# Patient Record
Sex: Female | Born: 1965 | Race: White | Hispanic: No | Marital: Married | Smoking: Never smoker
Health system: Southern US, Community
[De-identification: ages and names within clinical notes are randomized; demographics above are authoritative.]

## PROBLEM LIST (undated history)

## (undated) DIAGNOSIS — N189 Chronic kidney disease, unspecified: Secondary | ICD-10-CM

## (undated) DIAGNOSIS — M1990 Unspecified osteoarthritis, unspecified site: Secondary | ICD-10-CM

## (undated) DIAGNOSIS — Z8619 Personal history of other infectious and parasitic diseases: Secondary | ICD-10-CM

## (undated) DIAGNOSIS — R351 Nocturia: Secondary | ICD-10-CM

## (undated) DIAGNOSIS — G971 Other reaction to spinal and lumbar puncture: Secondary | ICD-10-CM

## (undated) DIAGNOSIS — J189 Pneumonia, unspecified organism: Secondary | ICD-10-CM

## (undated) DIAGNOSIS — N051 Unspecified nephritic syndrome with focal and segmental glomerular lesions: Secondary | ICD-10-CM

## (undated) DIAGNOSIS — I10 Essential (primary) hypertension: Secondary | ICD-10-CM

## (undated) DIAGNOSIS — G4700 Insomnia, unspecified: Secondary | ICD-10-CM

## (undated) DIAGNOSIS — M2550 Pain in unspecified joint: Secondary | ICD-10-CM

## (undated) HISTORY — PX: COLONOSCOPY: SHX174

## (undated) HISTORY — PX: ABDOMINAL HYSTERECTOMY: SHX81

## (undated) HISTORY — PX: HAND SURGERY: SHX662

---

## 1998-07-05 ENCOUNTER — Other Ambulatory Visit: Admission: RE | Admit: 1998-07-05 | Discharge: 1998-07-05 | Payer: Self-pay | Admitting: Obstetrics & Gynecology

## 1999-06-06 ENCOUNTER — Other Ambulatory Visit: Admission: RE | Admit: 1999-06-06 | Discharge: 1999-06-06 | Payer: Self-pay | Admitting: Obstetrics and Gynecology

## 1999-12-18 ENCOUNTER — Inpatient Hospital Stay (HOSPITAL_COMMUNITY): Admission: AD | Admit: 1999-12-18 | Discharge: 1999-12-21 | Payer: Self-pay | Admitting: Obstetrics and Gynecology

## 1999-12-18 ENCOUNTER — Encounter (INDEPENDENT_AMBULATORY_CARE_PROVIDER_SITE_OTHER): Payer: Self-pay | Admitting: Specialist

## 2000-01-24 ENCOUNTER — Other Ambulatory Visit: Admission: RE | Admit: 2000-01-24 | Discharge: 2000-01-24 | Payer: Self-pay | Admitting: Obstetrics and Gynecology

## 2000-07-28 DIAGNOSIS — G971 Other reaction to spinal and lumbar puncture: Secondary | ICD-10-CM

## 2000-07-28 HISTORY — DX: Other reaction to spinal and lumbar puncture: G97.1

## 2001-01-21 ENCOUNTER — Other Ambulatory Visit: Admission: RE | Admit: 2001-01-21 | Discharge: 2001-01-21 | Payer: Self-pay | Admitting: Obstetrics and Gynecology

## 2001-07-22 ENCOUNTER — Inpatient Hospital Stay (HOSPITAL_COMMUNITY): Admission: AD | Admit: 2001-07-22 | Discharge: 2001-07-22 | Payer: Self-pay | Admitting: Obstetrics and Gynecology

## 2001-07-25 ENCOUNTER — Encounter (INDEPENDENT_AMBULATORY_CARE_PROVIDER_SITE_OTHER): Payer: Self-pay

## 2001-07-25 ENCOUNTER — Inpatient Hospital Stay (HOSPITAL_COMMUNITY): Admission: AD | Admit: 2001-07-25 | Discharge: 2001-07-28 | Payer: Self-pay | Admitting: Obstetrics & Gynecology

## 2001-08-02 ENCOUNTER — Inpatient Hospital Stay (HOSPITAL_COMMUNITY): Admission: AD | Admit: 2001-08-02 | Discharge: 2001-08-02 | Payer: Self-pay | Admitting: Obstetrics & Gynecology

## 2001-09-01 ENCOUNTER — Other Ambulatory Visit: Admission: RE | Admit: 2001-09-01 | Discharge: 2001-09-01 | Payer: Self-pay | Admitting: Obstetrics and Gynecology

## 2001-12-01 ENCOUNTER — Ambulatory Visit (HOSPITAL_COMMUNITY): Admission: RE | Admit: 2001-12-01 | Discharge: 2001-12-01 | Payer: Self-pay | Admitting: Orthopedic Surgery

## 2001-12-01 ENCOUNTER — Encounter: Payer: Self-pay | Admitting: Orthopedic Surgery

## 2002-10-26 ENCOUNTER — Other Ambulatory Visit: Admission: RE | Admit: 2002-10-26 | Discharge: 2002-10-26 | Payer: Self-pay | Admitting: Obstetrics and Gynecology

## 2003-11-02 ENCOUNTER — Other Ambulatory Visit: Admission: RE | Admit: 2003-11-02 | Discharge: 2003-11-02 | Payer: Self-pay | Admitting: Obstetrics and Gynecology

## 2005-01-03 ENCOUNTER — Other Ambulatory Visit: Admission: RE | Admit: 2005-01-03 | Discharge: 2005-01-03 | Payer: Self-pay | Admitting: Obstetrics and Gynecology

## 2007-02-11 ENCOUNTER — Ambulatory Visit (HOSPITAL_COMMUNITY): Admission: RE | Admit: 2007-02-11 | Discharge: 2007-02-11 | Payer: Self-pay | Admitting: Chiropractic Medicine

## 2008-01-19 ENCOUNTER — Encounter (HOSPITAL_COMMUNITY): Admission: RE | Admit: 2008-01-19 | Discharge: 2008-03-29 | Payer: Self-pay | Admitting: Nephrology

## 2009-10-09 ENCOUNTER — Encounter (INDEPENDENT_AMBULATORY_CARE_PROVIDER_SITE_OTHER): Payer: Self-pay | Admitting: Obstetrics and Gynecology

## 2009-10-09 ENCOUNTER — Ambulatory Visit (HOSPITAL_COMMUNITY): Admission: RE | Admit: 2009-10-09 | Discharge: 2009-10-10 | Payer: Self-pay | Admitting: Obstetrics and Gynecology

## 2009-10-22 ENCOUNTER — Encounter (HOSPITAL_COMMUNITY): Admission: RE | Admit: 2009-10-22 | Discharge: 2010-01-20 | Payer: Self-pay | Admitting: Nephrology

## 2010-10-15 LAB — BASIC METABOLIC PANEL WITH GFR
BUN: 13 mg/dL (ref 6–23)
CO2: 25 meq/L (ref 19–32)
Calcium: 8.6 mg/dL (ref 8.4–10.5)
Chloride: 108 meq/L (ref 96–112)
Creatinine, Ser: 1.02 mg/dL (ref 0.4–1.2)
GFR calc non Af Amer: 59 mL/min — ABNORMAL LOW
Glucose, Bld: 97 mg/dL (ref 70–99)
Potassium: 4.2 meq/L (ref 3.5–5.1)
Sodium: 137 meq/L (ref 135–145)

## 2010-10-15 LAB — POCT HEMOGLOBIN-HEMACUE
Hemoglobin: 11.6 g/dL — ABNORMAL LOW (ref 12.0–15.0)
Hemoglobin: 9.9 g/dL — ABNORMAL LOW (ref 12.0–15.0)

## 2010-10-15 LAB — IRON AND TIBC
Iron: 20 ug/dL — ABNORMAL LOW (ref 42–135)
Saturation Ratios: 7 % — ABNORMAL LOW (ref 20–55)
TIBC: 297 ug/dL (ref 250–470)
UIBC: 277 ug/dL

## 2010-10-16 LAB — POCT HEMOGLOBIN-HEMACUE: Hemoglobin: 8.9 g/dL — ABNORMAL LOW (ref 12.0–15.0)

## 2010-10-18 LAB — CBC
HCT: 34.7 % — ABNORMAL LOW (ref 36.0–46.0)
Hemoglobin: 11.3 g/dL — ABNORMAL LOW (ref 12.0–15.0)
Hemoglobin: 6.8 g/dL — CL (ref 12.0–15.0)
RBC: 2.51 MIL/uL — ABNORMAL LOW (ref 3.87–5.11)
RBC: 4.15 MIL/uL (ref 3.87–5.11)
RDW: 14.6 % (ref 11.5–15.5)
WBC: 16.6 10*3/uL — ABNORMAL HIGH (ref 4.0–10.5)
WBC: 7.8 10*3/uL (ref 4.0–10.5)

## 2010-10-18 LAB — URINE MICROSCOPIC-ADD ON

## 2010-10-18 LAB — URINALYSIS, ROUTINE W REFLEX MICROSCOPIC
Bilirubin Urine: NEGATIVE
Glucose, UA: NEGATIVE mg/dL
Protein, ur: 30 mg/dL — AB
Specific Gravity, Urine: 1.015 (ref 1.005–1.030)

## 2010-10-18 LAB — COMPREHENSIVE METABOLIC PANEL
ALT: 17 U/L (ref 0–35)
Alkaline Phosphatase: 48 U/L (ref 39–117)
CO2: 28 mEq/L (ref 19–32)
Chloride: 104 mEq/L (ref 96–112)
Glucose, Bld: 81 mg/dL (ref 70–99)
Potassium: 3.9 mEq/L (ref 3.5–5.1)
Sodium: 137 mEq/L (ref 135–145)
Total Bilirubin: 0.2 mg/dL — ABNORMAL LOW (ref 0.3–1.2)
Total Protein: 6.9 g/dL (ref 6.0–8.3)

## 2010-10-18 LAB — HEMOGLOBIN AND HEMATOCRIT, BLOOD
HCT: 20.2 % — ABNORMAL LOW (ref 36.0–46.0)
Hemoglobin: 6.5 g/dL — CL (ref 12.0–15.0)

## 2010-10-18 LAB — PROTIME-INR: Prothrombin Time: 12.7 seconds (ref 11.6–15.2)

## 2012-01-27 ENCOUNTER — Other Ambulatory Visit: Payer: Self-pay | Admitting: Nephrology

## 2012-01-27 DIAGNOSIS — I10 Essential (primary) hypertension: Secondary | ICD-10-CM

## 2012-01-27 DIAGNOSIS — N181 Chronic kidney disease, stage 1: Secondary | ICD-10-CM

## 2012-02-09 ENCOUNTER — Ambulatory Visit
Admission: RE | Admit: 2012-02-09 | Discharge: 2012-02-09 | Disposition: A | Discharge status: Home / Self Care | Payer: BC Managed Care – PPO | Source: Ambulatory Visit | Attending: Nephrology | Admitting: Nephrology

## 2012-02-09 DIAGNOSIS — I10 Essential (primary) hypertension: Secondary | ICD-10-CM

## 2012-02-09 DIAGNOSIS — N181 Chronic kidney disease, stage 1: Secondary | ICD-10-CM

## 2013-02-03 IMAGING — US US RENAL
1 series · 14 of 25 positions shown · non-contrast
Comparison: None.

` *RADIOLOGY REPORT*
CLINICAL DATA: Chronic kidney disease, family history of renal cell
carcinoma, on medication for hypertension

RENAL/URINARY TRACT ULTRASOUND COMPLETE

[Series 1: us renal · 0.26mm/px · 14 of 32 slices shown]
[im 1/32]
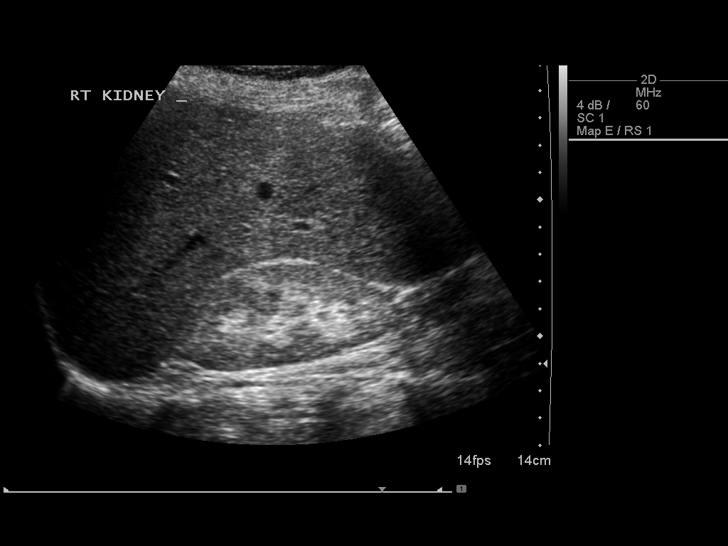
[im 3/32]
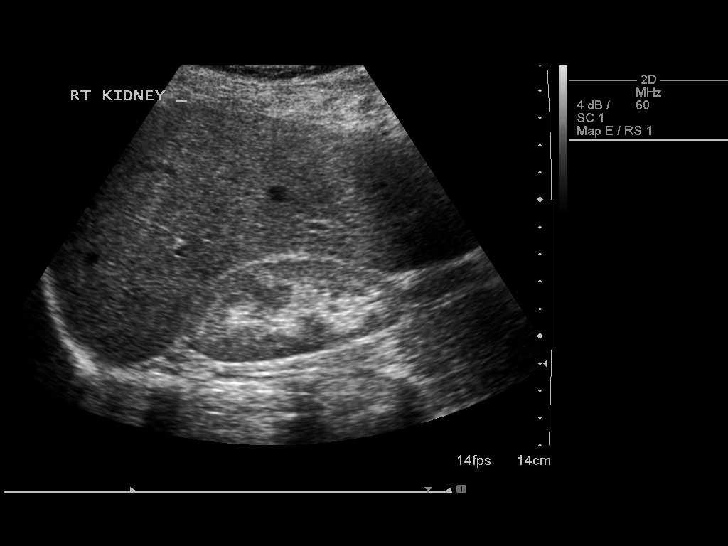
[im 6/32]
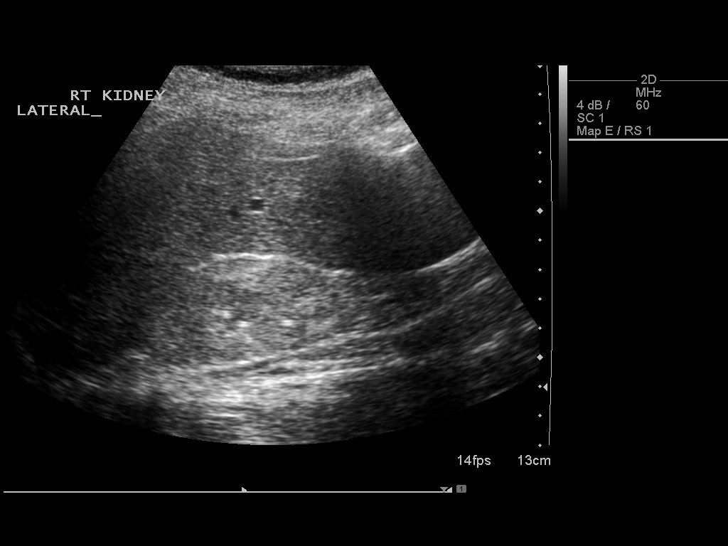
[im 8/32]
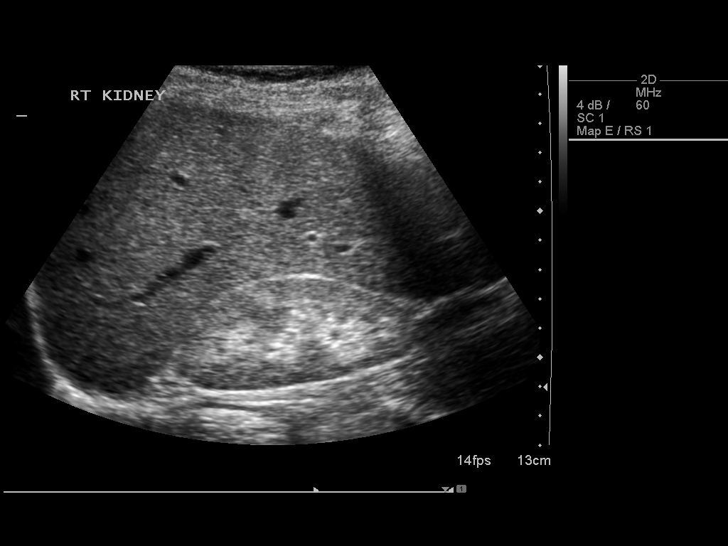
[im 11/32]
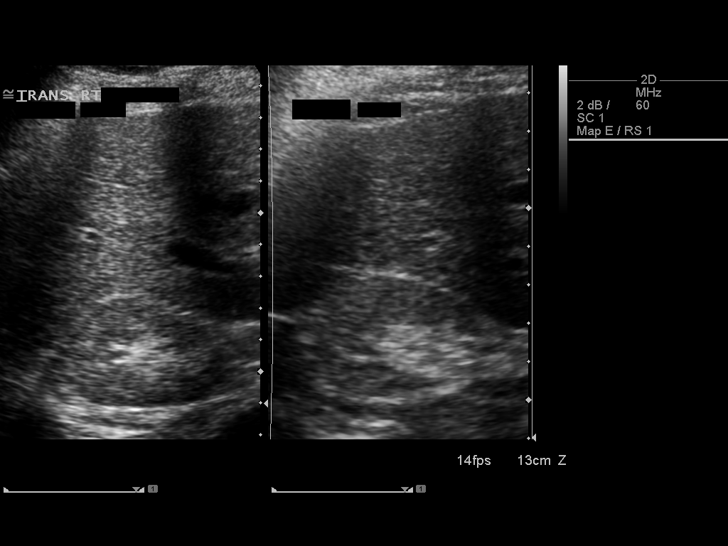
[im 12/32]
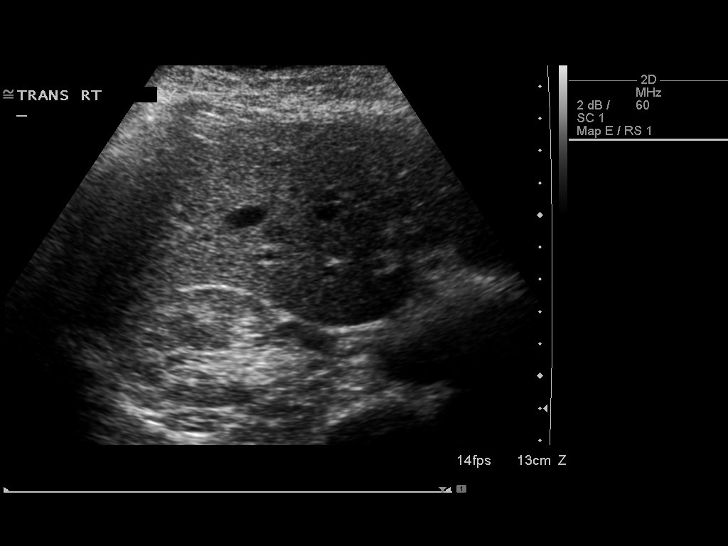
[im 15/32]
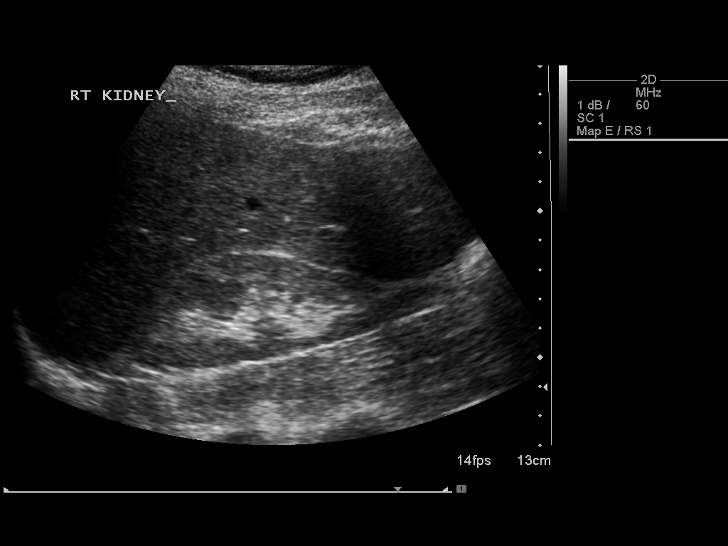
[im 17/32]
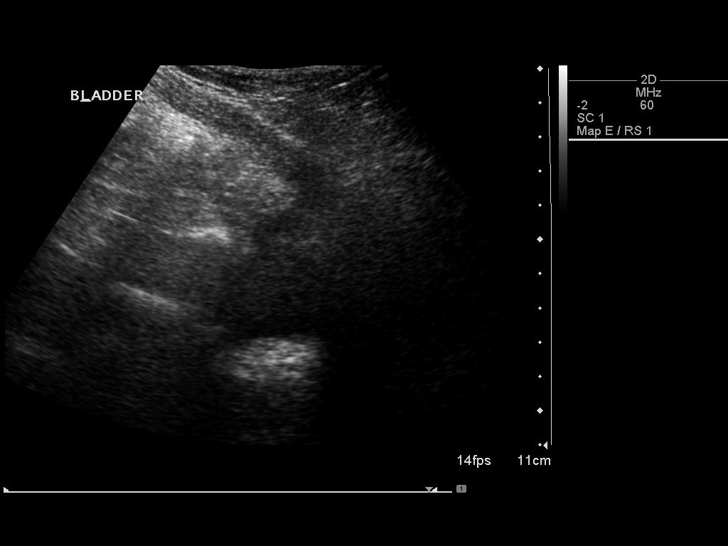
[im 20/32]
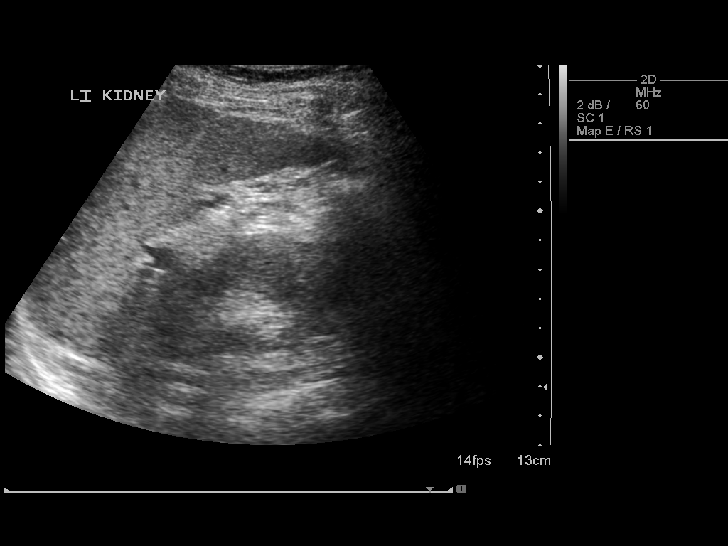
[im 21/32]
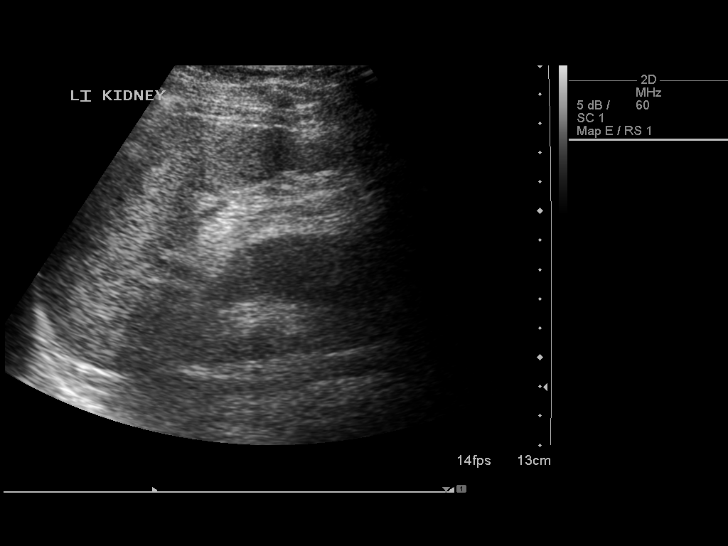
[im 24/32]
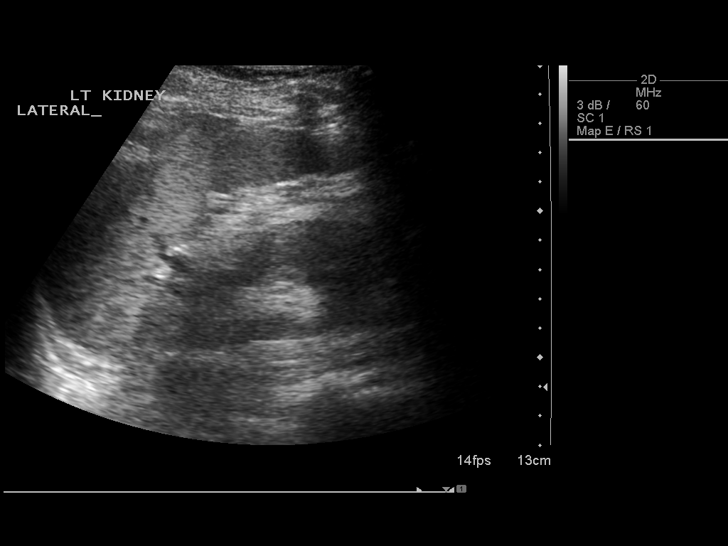
[im 26/32]
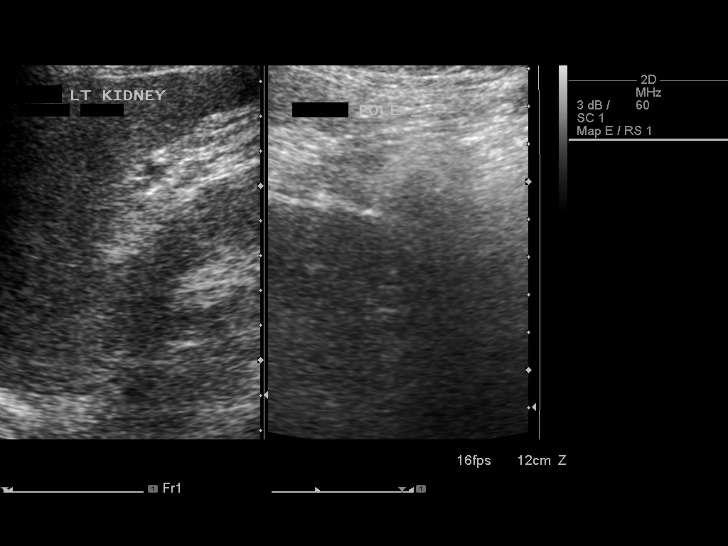
[im 29/32]
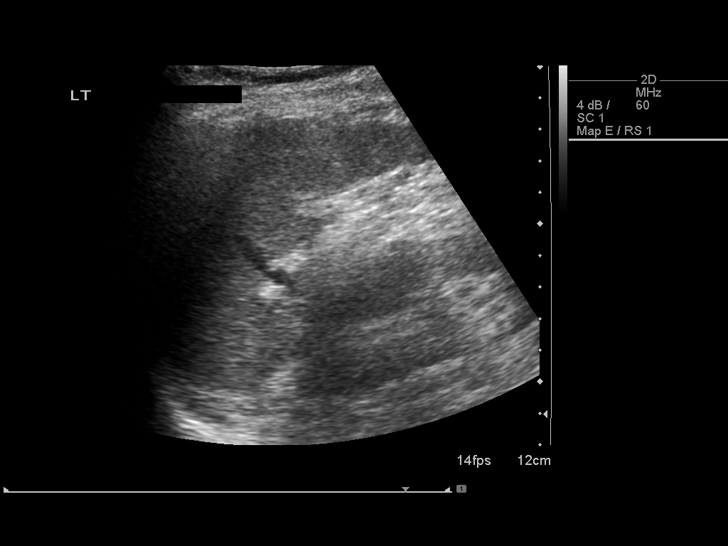
[im 32/32]
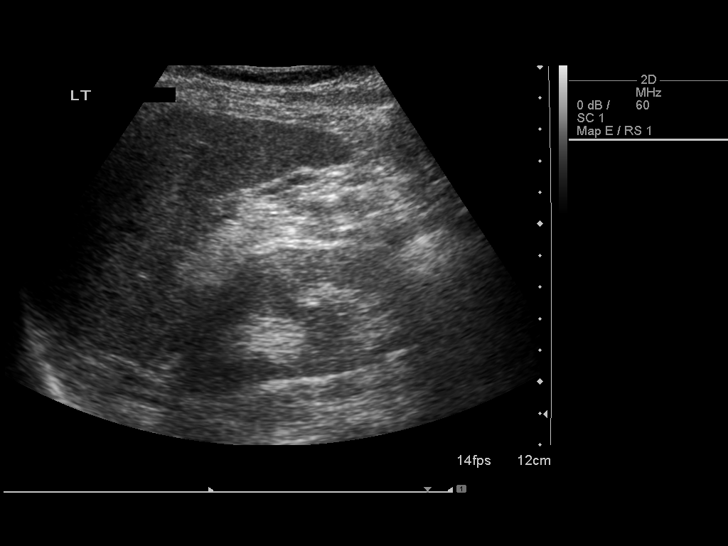

[14 of 25 positions shown; findings below may reference images not displayed]

FINDINGS: Right Kidney:  No hydronephrosis is seen.  The right kidney
measures 9.1 cm sagittally and is echogenic consistent with chronic
renal medical disease.  No renal mass is seen.

Left Kidney:  The left kidney is more difficult to visualize due to
bowel gas, measuring 9.4 cm sagittally.  No renal mass is evident.

Bladder:  The urinary bladder is not well distended.
IMPRESSION: 1.  No hydronephrosis.  No renal mass.
2.  Somewhat echogenic renal parenchyma consistent with chronic
renal medical disease.

## 2015-03-01 ENCOUNTER — Other Ambulatory Visit: Payer: Self-pay | Admitting: Obstetrics and Gynecology

## 2015-03-02 LAB — CYTOLOGY - PAP

## 2016-07-07 DIAGNOSIS — N189 Chronic kidney disease, unspecified: Secondary | ICD-10-CM | POA: Diagnosis present

## 2016-07-07 DIAGNOSIS — M1611 Unilateral primary osteoarthritis, right hip: Secondary | ICD-10-CM | POA: Diagnosis present

## 2016-07-07 DIAGNOSIS — I10 Essential (primary) hypertension: Secondary | ICD-10-CM | POA: Diagnosis present

## 2016-07-07 NOTE — H&P (Signed)
PREOPERATIVE H&P Patient ID: Helen Rogers MRN: [id number] DOB/AGE: 1965-10-09 50 y.o.  Chief Complaint: OA RIGHT HIP  Planned Procedure Date: 07/29/16  Medical and Cardiac Clearance by Dr. Lavone Neri   Additional clearance by Nephrology: Dr. Marval Regal  HPI: Helen Rogers is a 50 y.o. female with a history of HTN and CKD dt FSGS who presents for evaluation of OA RIGHT HIP. The patient has a history of pain and functional disability in the right hip due to arthritis and has failed non-surgical conservative treatments for greater than 12 weeks to include NSAID's and/or analgesics, corticosteriod injections and activity modification.  Onset of symptoms was gradual, starting 3 +years ago with gradually worsening course since that time.  Patient currently rates pain at 9 out of 10 with activity. Patient has night pain, worsening of pain with activity and weight bearing, pain that interferes with activities of daily living and pain with passive range of motion.  Patient has evidence of periarticular osteophytes and joint space narrowing by imaging studies.  There is no active infection.  Past Medical History: HTN, CKD  Past Surgical History: Hysterectomy 2011  Allergies: NKDA.  Oxycodone has caused some itching.  Medications: Lisinopril 20mg  q day   Social History: Married, non-smoker.  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  Family History: Mother with HTN, Father: MI, HTN.  Brother HTN, kidney dz.  Sister with HTN, cancer. Grandparents w/ HTN  ROS: Currently denies lightheadedness, dizziness, Fever, chills, CP, SOB.   No personal history of DVT, PE, MI, or CVA. No loose teeth or dentures All other systems have been reviewed and were otherwise currently negative with the exception of those mentioned in the HPI and as above.  Objective: Vitals: Ht: 5'9" Wt: 196 Temp: 98 BP: 134/82 Pulse: 80 O2 100 % on room air. Physical Exam: General: Alert, NAD. Trendelenberg Gait  HEENT: EOMI, Good Neck Extension   Pulm: No increased work of breathing.  Clear B/L A/P w/o crackle or wheeze.  CV: RRR, No m/g/r appreciated  GI: soft, NT, ND Neuro: Neuro grossly intact b/l upper/lower ext.  Sensation intact distally Skin: No lesions in the area of chief complaint MSK/Surgical Site: Right Hip Non tender over greater trochanter.  Pain with passive ROM.  Positive Stinchfield.  5/5 strength.  NVI.  Sensation intact distally.  Imaging Review Plain radiographs demonstrate severe degenerative joint disease of the right hip.   Assessment: OA RIGHT HIP Principal Problem:   Primary osteoarthritis of right hip Active Problems:   Essential hypertension   Chronic kidney disease -h/o FSGS  Plan: Plan for Procedure(s): TOTAL HIP ARTHROPLASTY ANTERIOR APPROACH  The patient history, physical exam, clinical judgement of the provider and imaging are consistent with end stage degenerative joint disease and joint arthroplasty is deemed medically necessary. The treatment options including medical management, injection therapy, and arthroplasty were discussed at length. The risks and benefits of Procedure(s): TOTAL HIP ARTHROPLASTY ANTERIOR APPROACH were presented and reviewed.  The risks of nonoperative treatment, versus surgical intervention including but not limited to continued pain, aseptic loosening, stiffness, dislocation/subluxation, infection, bleeding, nerve injury, blood clots, cardiopulmonary complications, morbidity, mortality, among others were discussed. The patient verbalizes understanding and wishes to proceed with the plan.  Patient is being admitted for inpatient treatment for surgery, pain control, PT, OT, prophylactic antibiotics, VTE prophylaxis, progressive ambulation, ADL's and discharge planning.   Dental prophylaxis discussed and recommended for 2 years postoperatively.  The patient does meet the criteria for TXA which will be used perioperatively via  IV.   Xarelto  will be used postoperatively  for DVT prophylaxis in addition to SCDs, and early ambulation.  Kidney Dz - avoid nsaids. The patient is planning to be discharged home with home health services in care of her husband.  Prudencio Burly III, PA-C 07/07/2016 9:48 AM

## 2016-07-16 NOTE — Pre-Procedure Instructions (Signed)
SUMAYO FOGLIA  07/16/2016      [id number] [address], [place] [address] Phone: [phone] Fax: [phone]    Your procedure is scheduled on Tues, Jan 2 @ 7:30 AM  Report to [facility] Admitting at 5:30 AM  Call this number if you have problems the morning of surgery:  [phone]   Remember:  Do not eat food or drink liquids after midnight.               Stop taking your Aspirin along with any Vitamins or Herbal Medications a week prior to surgery. No Goody's,BC's,Aleve,Advil,Motrin,or Fish Oil.    Do not wear jewelry, make-up or nail polish.  Do not wear lotions, powders, perfumes, or deoderant.  Do not shave 48 hours prior to surgery.    Do not bring valuables to the hospital.  [facility] is not responsible for any belongings or valuables.  Contacts, dentures or bridgework may not be worn into surgery.  Leave your suitcase in the car.  After surgery it may be brought to your room.  For patients admitted to the hospital, discharge time will be determined by your treatment team.  Patients discharged the day of surgery will not be allowed to drive home.    [facility]  Before surgery, you can play an important role.  Because skin is not sterile, your skin needs to be as free of germs as possible.  You can reduce the number of germs on you skin by washing with CHG (chlorahexidine gluconate) soap before surgery.  CHG is an antiseptic cleaner which kills germs and bonds with the skin to continue killing germs even after washing.  Please DO NOT use if you have an allergy to CHG or antibacterial soaps.  If your skin becomes reddened/irritated stop using the CHG and inform your nurse when you arrive at Short Stay.  Do not shave (including legs and underarms) for at least 48 hours prior to the first CHG shower.  You may shave your face.  Please follow these  instructions carefully:   1.  Shower with CHG Soap the night before surgery and the                                morning of Surgery.  2.  If you choose to wash your hair, wash your hair first as usual with your       normal shampoo.  3.  After you shampoo, rinse your hair and body thoroughly to remove the                      Shampoo.  4.  Use CHG as you would any other liquid soap.  You can apply chg directly       to the skin and wash gently with scrungie or a clean washcloth.  5.  Apply the CHG Soap to your body ONLY FROM THE NECK DOWN.        Do not use on open wounds or open sores.  Avoid contact with your eyes,       ears, mouth and genitals (private parts).  Wash genitals (private parts)       with your normal soap.  6.  Wash thoroughly, paying special attention to the area where your surgery  will be performed.  7.  Thoroughly rinse your body with warm water from the neck down.  8.  DO NOT shower/wash with your normal soap after using and rinsing off       the CHG Soap.  9.  Pat yourself dry with a clean towel.            10.  Wear clean pajamas.            11.  Place clean sheets on your bed the night of your first shower and do not        sleep with pets.  Day of Surgery  Do not apply any lotions/deoderants the morning of surgery.  Please wear clean clothes to the hospital/surgery center.    Please read over the following fact sheets that you were given. Pain Booklet, Coughing and Deep Breathing, MRSA Information and Surgical Site Infection Prevention

## 2016-07-17 ENCOUNTER — Encounter (HOSPITAL_COMMUNITY): Payer: Self-pay

## 2016-07-17 ENCOUNTER — Encounter (HOSPITAL_COMMUNITY)
Admission: RE | Admit: 2016-07-17 | Discharge: 2016-07-17 | Disposition: A | Discharge status: Home / Self Care | Payer: 59 | Source: Ambulatory Visit | Attending: Orthopedic Surgery | Admitting: Orthopedic Surgery

## 2016-07-17 DIAGNOSIS — M1611 Unilateral primary osteoarthritis, right hip: Secondary | ICD-10-CM | POA: Insufficient documentation

## 2016-07-17 DIAGNOSIS — Z01812 Encounter for preprocedural laboratory examination: Secondary | ICD-10-CM | POA: Diagnosis not present

## 2016-07-17 HISTORY — DX: Insomnia, unspecified: G47.00

## 2016-07-17 HISTORY — DX: Personal history of other infectious and parasitic diseases: Z86.19

## 2016-07-17 HISTORY — DX: Essential (primary) hypertension: I10

## 2016-07-17 HISTORY — DX: Nocturia: R35.1

## 2016-07-17 HISTORY — DX: Unspecified osteoarthritis, unspecified site: M19.90

## 2016-07-17 HISTORY — DX: Unspecified nephritic syndrome with focal and segmental glomerular lesions: N05.1

## 2016-07-17 HISTORY — DX: Other reaction to spinal and lumbar puncture: G97.1

## 2016-07-17 HISTORY — DX: Pain in unspecified joint: M25.50

## 2016-07-17 HISTORY — DX: Chronic kidney disease, unspecified: N18.9

## 2016-07-17 LAB — URINALYSIS, ROUTINE W REFLEX MICROSCOPIC
BILIRUBIN URINE: NEGATIVE
Glucose, UA: NEGATIVE mg/dL
Hgb urine dipstick: NEGATIVE
KETONES UR: NEGATIVE mg/dL
LEUKOCYTES UA: NEGATIVE
Nitrite: NEGATIVE
PH: 5 (ref 5.0–8.0)
RBC / HPF: NONE SEEN RBC/hpf (ref 0–5)
SQUAMOUS EPITHELIAL / LPF: NONE SEEN
Specific Gravity, Urine: 1.012 (ref 1.005–1.030)

## 2016-07-17 LAB — ABO/RH: ABO/RH(D): O POS

## 2016-07-17 LAB — BASIC METABOLIC PANEL
ANION GAP: 8 (ref 5–15)
BUN: 14 mg/dL (ref 6–20)
CO2: 27 mmol/L (ref 22–32)
Calcium: 10.2 mg/dL (ref 8.9–10.3)
Chloride: 105 mmol/L (ref 101–111)
Creatinine, Ser: 1.32 mg/dL — ABNORMAL HIGH (ref 0.44–1.00)
GFR calc Af Amer: 53 mL/min — ABNORMAL LOW (ref >60)
GFR, EST NON AFRICAN AMERICAN: 46 mL/min — AB (ref >60)
Glucose, Bld: 90 mg/dL (ref 65–99)
POTASSIUM: 4.3 mmol/L (ref 3.5–5.1)
SODIUM: 140 mmol/L (ref 135–145)

## 2016-07-17 LAB — CBC
HEMATOCRIT: 42.8 % (ref 36.0–46.0)
Hemoglobin: 14 g/dL (ref 12.0–15.0)
MCH: 26.8 pg (ref 26.0–34.0)
MCHC: 32.7 g/dL (ref 30.0–36.0)
MCV: 82 fL (ref 78.0–100.0)
Platelets: 266 10*3/uL (ref 150–400)
RBC: 5.22 MIL/uL — ABNORMAL HIGH (ref 3.87–5.11)
RDW: 13.7 % (ref 11.5–15.5)
WBC: 7.5 10*3/uL (ref 4.0–10.5)

## 2016-07-17 LAB — TYPE AND SCREEN
ABO/RH(D): O POS
Antibody Screen: NEGATIVE

## 2016-07-17 LAB — SURGICAL PCR SCREEN
MRSA, PCR: NEGATIVE
STAPHYLOCOCCUS AUREUS: NEGATIVE

## 2016-07-17 LAB — APTT: aPTT: 30 seconds (ref 24–36)

## 2016-07-17 LAB — PROTIME-INR
INR: 0.89
PROTHROMBIN TIME: 12 s (ref 11.4–15.2)

## 2016-07-17 MED ORDER — CHLORHEXIDINE GLUCONATE 4 % EX LIQD: 60.0000 mL | Freq: Once | CUTANEOUS | Status: DC

## 2016-07-17 NOTE — Progress Notes (Addendum)
Cardiologist denies  Medical Md is Dr.Tamieka Lavone Neri  Echo denies  Stress test denies  Heart cath denies  EKG to request from [place] denies in past yr  Kidney Dr.Joseph Colodonado-to request clearance

## 2016-07-18 ENCOUNTER — Encounter (HOSPITAL_COMMUNITY): Payer: Self-pay

## 2016-07-18 NOTE — Progress Notes (Signed)
Anesthesia chart review: Patient is a 50 year old female scheduled for right total hip arthroplasty, anterior approach on 07/29/16 by Dr. Edmonia Lynch.  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  PCP is Dr. Helane Rima. Following 04/14/16 visit, patient was felt medically stable for surgery.   Nephrologist is Dr. Donato Heinz. Last visit 03/03/16. He did discuss with her that should she need THA, he recommended that she hold lisinopril 48 hours prior to surgery and not resume until after she is recovered in order, in order to minimize the risk of AKI/CKD. Could consider adding b-blocker while off ACE inhibitor if needed for BP control.  Meds include lisinopril, melatonin, diphenhydramine, Tums.  BP 119/83   Pulse 81   Temp 36.8 C   Resp 20   Ht 5\' 9"  (1.753 m)   Wt 192 lb 14.4 oz (87.5 kg)   SpO2 99%   BMI 28.49 kg/m   EKG 04/14/16 (done by Dr. Lavone Neri as part of pre-oeprative evaluation): SR.    Preoperative labs noted. BUN 14, Cr 1.32. H/H 14.0/42.8. PT/PTT WNL. Glucose 90. T&S done.   If no acute changes then I anticipate that she can proceed as planned.  George Hugh [facility] Short Stay Center/Anesthesiology Phone [phone] 07/18/2016 2:42 PM

## 2016-07-27 MED ORDER — TRANEXAMIC ACID 1000 MG/10ML IV SOLN
1000.0000 mg | INTRAVENOUS | Status: AC
  Administered 2016-07-29: 1000 mg via INTRAVENOUS
  Filled 2016-07-27 (×2): qty 10

## 2016-07-27 MED ORDER — TRANEXAMIC ACID 1000 MG/10ML IV SOLN
2000.0000 mg | Freq: Once | INTRAVENOUS | Status: DC
  Filled 2016-07-27 (×2): qty 20

## 2016-07-28 MED ORDER — ACETAMINOPHEN 500 MG PO TABS
1000.0000 mg | ORAL_TABLET | Freq: Once | ORAL | Status: AC
  Administered 2016-07-29: 1000 mg via ORAL
  Filled 2016-07-28: qty 2

## 2016-07-28 MED ORDER — CEFAZOLIN SODIUM-DEXTROSE 2-4 GM/100ML-% IV SOLN
2.0000 g | INTRAVENOUS | Status: AC
  Administered 2016-07-29: 2 g via INTRAVENOUS
  Filled 2016-07-28: qty 100

## 2016-07-28 MED ORDER — GABAPENTIN 300 MG PO CAPS
300.0000 mg | ORAL_CAPSULE | Freq: Once | ORAL | Status: AC
  Administered 2016-07-29: 300 mg via ORAL
  Filled 2016-07-28: qty 1

## 2016-07-29 ENCOUNTER — Inpatient Hospital Stay (HOSPITAL_COMMUNITY): Payer: 59 | Admitting: Vascular Surgery

## 2016-07-29 ENCOUNTER — Encounter (HOSPITAL_COMMUNITY)
Admission: RE | Disposition: A | Discharge status: Home Health | Payer: Self-pay | Source: Ambulatory Visit | Attending: Orthopedic Surgery

## 2016-07-29 ENCOUNTER — Inpatient Hospital Stay (HOSPITAL_COMMUNITY): Payer: 59

## 2016-07-29 ENCOUNTER — Inpatient Hospital Stay (HOSPITAL_COMMUNITY): Payer: 59 | Admitting: Certified Registered Nurse Anesthetist

## 2016-07-29 ENCOUNTER — Encounter (HOSPITAL_COMMUNITY): Payer: Self-pay | Admitting: *Deleted

## 2016-07-29 ENCOUNTER — Inpatient Hospital Stay (HOSPITAL_COMMUNITY)
Admission: RE | Admit: 2016-07-29 | Discharge: 2016-07-30 | DRG: 470 | Disposition: A | Discharge status: Home Health | Payer: 59 | Source: Ambulatory Visit | Attending: Orthopedic Surgery | Admitting: Orthopedic Surgery

## 2016-07-29 DIAGNOSIS — Z419 Encounter for procedure for purposes other than remedying health state, unspecified: Secondary | ICD-10-CM

## 2016-07-29 DIAGNOSIS — M1611 Unilateral primary osteoarthritis, right hip: Principal | ICD-10-CM | POA: Diagnosis present

## 2016-07-29 DIAGNOSIS — G4700 Insomnia, unspecified: Secondary | ICD-10-CM | POA: Diagnosis present

## 2016-07-29 DIAGNOSIS — I129 Hypertensive chronic kidney disease with stage 1 through stage 4 chronic kidney disease, or unspecified chronic kidney disease: Secondary | ICD-10-CM | POA: Diagnosis present

## 2016-07-29 DIAGNOSIS — N182 Chronic kidney disease, stage 2 (mild): Secondary | ICD-10-CM | POA: Diagnosis present

## 2016-07-29 DIAGNOSIS — N189 Chronic kidney disease, unspecified: Secondary | ICD-10-CM | POA: Diagnosis present

## 2016-07-29 DIAGNOSIS — Z90710 Acquired absence of both cervix and uterus: Secondary | ICD-10-CM | POA: Diagnosis not present

## 2016-07-29 DIAGNOSIS — Z8249 Family history of ischemic heart disease and other diseases of the circulatory system: Secondary | ICD-10-CM

## 2016-07-29 DIAGNOSIS — Z79899 Other long term (current) drug therapy: Secondary | ICD-10-CM | POA: Diagnosis not present

## 2016-07-29 DIAGNOSIS — Z23 Encounter for immunization: Secondary | ICD-10-CM

## 2016-07-29 DIAGNOSIS — I10 Essential (primary) hypertension: Secondary | ICD-10-CM | POA: Diagnosis present

## 2016-07-29 HISTORY — PX: TOTAL HIP ARTHROPLASTY: SHX124

## 2016-07-29 SURGERY — ARTHROPLASTY, HIP, TOTAL, ANTERIOR APPROACH
Anesthesia: Spinal | Site: Hip | Laterality: Right

## 2016-07-29 MED ORDER — ONDANSETRON HCL 4 MG/2ML IJ SOLN: 4.0000 mg | Freq: Once | INTRAMUSCULAR | Status: DC | PRN

## 2016-07-29 MED ORDER — RIVAROXABAN 10 MG PO TABS: 10.0000 mg | ORAL_TABLET | Freq: Every day | ORAL | 0 refills | Status: DC

## 2016-07-29 MED ORDER — PHENYLEPHRINE HCL 10 MG/ML IJ SOLN
INTRAMUSCULAR | Status: DC | PRN
  Administered 2016-07-29: 25 ug/min via INTRAVENOUS

## 2016-07-29 MED ORDER — KETOROLAC TROMETHAMINE 15 MG/ML IJ SOLN
15.0000 mg | Freq: Four times a day (QID) | INTRAMUSCULAR | Status: AC
  Administered 2016-07-29 – 2016-07-30 (×3): 15 mg via INTRAVENOUS
  Filled 2016-07-29 (×3): qty 1

## 2016-07-29 MED ORDER — SENNA 8.6 MG PO TABS
1.0000 | ORAL_TABLET | Freq: Two times a day (BID) | ORAL | Status: DC
  Administered 2016-07-29 – 2016-07-30 (×3): 8.6 mg via ORAL
  Filled 2016-07-29 (×3): qty 1

## 2016-07-29 MED ORDER — METHOCARBAMOL 500 MG PO TABS
500.0000 mg | ORAL_TABLET | Freq: Four times a day (QID) | ORAL | Status: DC | PRN
  Administered 2016-07-29 – 2016-07-30 (×3): 500 mg via ORAL
  Filled 2016-07-29 (×3): qty 1

## 2016-07-29 MED ORDER — ONDANSETRON HCL 4 MG/2ML IJ SOLN
INTRAMUSCULAR | Status: AC
  Filled 2016-07-29: qty 2

## 2016-07-29 MED ORDER — HYDROCODONE-ACETAMINOPHEN 5-325 MG PO TABS: 1.0000 | ORAL_TABLET | ORAL | 0 refills | Status: DC | PRN

## 2016-07-29 MED ORDER — DEXTROSE-NACL 5-0.45 % IV SOLN
INTRAVENOUS | Status: DC
  Administered 2016-07-29: 20:00:00 via INTRAVENOUS

## 2016-07-29 MED ORDER — HYDROMORPHONE HCL 1 MG/ML IJ SOLN: 0.2500 mg | INTRAMUSCULAR | Status: DC | PRN

## 2016-07-29 MED ORDER — METHOCARBAMOL 1000 MG/10ML IJ SOLN: 500.0000 mg | Freq: Four times a day (QID) | INTRAVENOUS | Status: DC | PRN

## 2016-07-29 MED ORDER — ACETAMINOPHEN 650 MG RE SUPP: 650.0000 mg | Freq: Four times a day (QID) | RECTAL | Status: DC | PRN

## 2016-07-29 MED ORDER — LACTATED RINGERS IV SOLN: INTRAVENOUS | Status: DC

## 2016-07-29 MED ORDER — MIDAZOLAM HCL 2 MG/2ML IJ SOLN
INTRAMUSCULAR | Status: DC | PRN
  Administered 2016-07-29 (×2): 1 mg via INTRAVENOUS

## 2016-07-29 MED ORDER — METHOCARBAMOL 500 MG PO TABS: 500.0000 mg | ORAL_TABLET | Freq: Four times a day (QID) | ORAL | 0 refills | Status: DC | PRN

## 2016-07-29 MED ORDER — LIDOCAINE 2% (20 MG/ML) 5 ML SYRINGE
INTRAMUSCULAR | Status: AC
  Filled 2016-07-29: qty 5

## 2016-07-29 MED ORDER — ONDANSETRON HCL 4 MG/2ML IJ SOLN: 4.0000 mg | Freq: Four times a day (QID) | INTRAMUSCULAR | Status: DC | PRN

## 2016-07-29 MED ORDER — BUPIVACAINE HCL (PF) 0.25 % IJ SOLN
INTRAMUSCULAR | Status: AC
  Filled 2016-07-29: qty 30

## 2016-07-29 MED ORDER — ONDANSETRON HCL 4 MG PO TABS: 4.0000 mg | ORAL_TABLET | Freq: Three times a day (TID) | ORAL | 0 refills | Status: DC | PRN

## 2016-07-29 MED ORDER — POLYETHYLENE GLYCOL 3350 17 G PO PACK: 17.0000 g | PACK | Freq: Every day | ORAL | Status: DC | PRN

## 2016-07-29 MED ORDER — PROPOFOL 1000 MG/100ML IV EMUL
INTRAVENOUS | Status: AC
  Filled 2016-07-29: qty 100

## 2016-07-29 MED ORDER — ONDANSETRON HCL 4 MG PO TABS: 4.0000 mg | ORAL_TABLET | Freq: Four times a day (QID) | ORAL | Status: DC | PRN

## 2016-07-29 MED ORDER — FENTANYL CITRATE (PF) 100 MCG/2ML IJ SOLN
INTRAMUSCULAR | Status: DC | PRN
  Administered 2016-07-29: 100 ug via INTRAVENOUS

## 2016-07-29 MED ORDER — CEFAZOLIN SODIUM-DEXTROSE 2-4 GM/100ML-% IV SOLN
2.0000 g | Freq: Four times a day (QID) | INTRAVENOUS | Status: AC
  Administered 2016-07-29 (×2): 2 g via INTRAVENOUS
  Filled 2016-07-29 (×2): qty 100

## 2016-07-29 MED ORDER — KETOROLAC TROMETHAMINE 30 MG/ML IJ SOLN
INTRAMUSCULAR | Status: AC
  Filled 2016-07-29: qty 1

## 2016-07-29 MED ORDER — SODIUM CHLORIDE 0.9 % IV SOLN
INTRAVENOUS | Status: DC | PRN
  Administered 2016-07-29: 2000 mg via TOPICAL

## 2016-07-29 MED ORDER — DIPHENHYDRAMINE HCL 12.5 MG/5ML PO ELIX: 12.5000 mg | ORAL_SOLUTION | ORAL | Status: DC | PRN

## 2016-07-29 MED ORDER — RIVAROXABAN 10 MG PO TABS
10.0000 mg | ORAL_TABLET | Freq: Every day | ORAL | Status: DC
  Administered 2016-07-30: 10 mg via ORAL
  Filled 2016-07-29: qty 1

## 2016-07-29 MED ORDER — LACTATED RINGERS IV SOLN
INTRAVENOUS | Status: DC | PRN
  Administered 2016-07-29 (×3): via INTRAVENOUS

## 2016-07-29 MED ORDER — PROPOFOL 500 MG/50ML IV EMUL
INTRAVENOUS | Status: DC | PRN
  Administered 2016-07-29: 25 ug/kg/min via INTRAVENOUS

## 2016-07-29 MED ORDER — BUPIVACAINE-EPINEPHRINE 0.25% -1:200000 IJ SOLN
INTRAMUSCULAR | Status: DC | PRN
  Administered 2016-07-29: 30 mL

## 2016-07-29 MED ORDER — METOPROLOL TARTRATE 5 MG/5ML IV SOLN
INTRAVENOUS | Status: AC
  Filled 2016-07-29: qty 5

## 2016-07-29 MED ORDER — LISINOPRIL 20 MG PO TABS
20.0000 mg | ORAL_TABLET | Freq: Every day | ORAL | Status: DC
  Administered 2016-07-29: 20 mg via ORAL
  Filled 2016-07-29: qty 1

## 2016-07-29 MED ORDER — METOCLOPRAMIDE HCL 5 MG PO TABS: 5.0000 mg | ORAL_TABLET | Freq: Three times a day (TID) | ORAL | Status: DC | PRN

## 2016-07-29 MED ORDER — PNEUMOCOCCAL VAC POLYVALENT 25 MCG/0.5ML IJ INJ
0.5000 mL | INJECTION | INTRAMUSCULAR | Status: AC
  Administered 2016-07-30: 0.5 mL via INTRAMUSCULAR
  Filled 2016-07-29: qty 0.5

## 2016-07-29 MED ORDER — DOCUSATE SODIUM 100 MG PO CAPS: 100.0000 mg | ORAL_CAPSULE | Freq: Two times a day (BID) | ORAL | 0 refills | Status: DC

## 2016-07-29 MED ORDER — MORPHINE SULFATE (PF) 2 MG/ML IV SOLN: 2.0000 mg | INTRAVENOUS | Status: DC | PRN

## 2016-07-29 MED ORDER — METOPROLOL TARTARATE 1 MG/ML SYRINGE (5ML)
Status: DC | PRN
Start: 2016-07-29 — End: 2016-07-29
  Administered 2016-07-29 (×3): 1 mg via INTRAVENOUS
  Administered 2016-07-29: 2 mg via INTRAVENOUS

## 2016-07-29 MED ORDER — MEPERIDINE HCL 25 MG/ML IJ SOLN: 6.2500 mg | INTRAMUSCULAR | Status: DC | PRN

## 2016-07-29 MED ORDER — METOCLOPRAMIDE HCL 5 MG/ML IJ SOLN: 5.0000 mg | Freq: Three times a day (TID) | INTRAMUSCULAR | Status: DC | PRN

## 2016-07-29 MED ORDER — PROPOFOL 10 MG/ML IV BOLUS
INTRAVENOUS | Status: DC | PRN
  Administered 2016-07-29: 40 mg via INTRAVENOUS
  Administered 2016-07-29 (×2): 30 mg via INTRAVENOUS
  Administered 2016-07-29: 50 mg via INTRAVENOUS
  Administered 2016-07-29: 30 mg via INTRAVENOUS
  Administered 2016-07-29: 20 mg via INTRAVENOUS

## 2016-07-29 MED ORDER — KETOROLAC TROMETHAMINE 30 MG/ML IJ SOLN
INTRAMUSCULAR | Status: DC | PRN
  Administered 2016-07-29: 30 mg

## 2016-07-29 MED ORDER — GLYCOPYRROLATE 0.2 MG/ML IJ SOLN
INTRAMUSCULAR | Status: DC | PRN
  Administered 2016-07-29: 0.2 mg via INTRAVENOUS

## 2016-07-29 MED ORDER — INFLUENZA VAC SPLIT QUAD 0.5 ML IM SUSY
0.5000 mL | PREFILLED_SYRINGE | INTRAMUSCULAR | Status: AC
  Administered 2016-07-30: 0.5 mL via INTRAMUSCULAR

## 2016-07-29 MED ORDER — HYDROCODONE-ACETAMINOPHEN 5-325 MG PO TABS
1.0000 | ORAL_TABLET | ORAL | Status: DC | PRN
  Administered 2016-07-29 – 2016-07-30 (×4): 2 via ORAL
  Filled 2016-07-29 (×4): qty 2

## 2016-07-29 MED ORDER — SORBITOL 70 % SOLN: 30.0000 mL | Freq: Every day | Status: DC | PRN

## 2016-07-29 MED ORDER — EPINEPHRINE PF 1 MG/ML IJ SOLN
INTRAMUSCULAR | Status: AC
  Filled 2016-07-29: qty 1

## 2016-07-29 MED ORDER — KETAMINE HCL 10 MG/ML IJ SOLN
INTRAMUSCULAR | Status: DC | PRN
  Administered 2016-07-29 (×2): 20 mg via INTRAVENOUS

## 2016-07-29 MED ORDER — FLEET ENEMA 7-19 GM/118ML RE ENEM: 1.0000 | ENEMA | Freq: Once | RECTAL | Status: DC | PRN

## 2016-07-29 MED ORDER — ACETAMINOPHEN 325 MG PO TABS: 650.0000 mg | ORAL_TABLET | Freq: Four times a day (QID) | ORAL | Status: DC | PRN

## 2016-07-29 MED ORDER — 0.9 % SODIUM CHLORIDE (POUR BTL) OPTIME
TOPICAL | Status: DC | PRN
  Administered 2016-07-29: 1000 mL

## 2016-07-29 MED ORDER — DEXAMETHASONE SODIUM PHOSPHATE 10 MG/ML IJ SOLN
10.0000 mg | Freq: Once | INTRAMUSCULAR | Status: AC
  Administered 2016-07-30: 10 mg via INTRAVENOUS
  Filled 2016-07-29: qty 1

## 2016-07-29 MED ORDER — PHENOL 1.4 % MT LIQD: 1.0000 | OROMUCOSAL | Status: DC | PRN

## 2016-07-29 MED ORDER — ONDANSETRON HCL 4 MG/2ML IJ SOLN
INTRAMUSCULAR | Status: DC | PRN
  Administered 2016-07-29: 4 mg via INTRAVENOUS

## 2016-07-29 MED ORDER — SODIUM CHLORIDE FLUSH 0.9 % IV SOLN
INTRAVENOUS | Status: DC | PRN
  Administered 2016-07-29: 19 mL via INTRAVENOUS

## 2016-07-29 MED ORDER — LIDOCAINE HCL (CARDIAC) 20 MG/ML IV SOLN
INTRAVENOUS | Status: DC | PRN
  Administered 2016-07-29: 50 mg via INTRATRACHEAL

## 2016-07-29 MED ORDER — FENTANYL CITRATE (PF) 100 MCG/2ML IJ SOLN
INTRAMUSCULAR | Status: AC
  Filled 2016-07-29: qty 2

## 2016-07-29 MED ORDER — KETAMINE HCL 10 MG/ML IJ SOLN
INTRAMUSCULAR | Status: AC
  Filled 2016-07-29: qty 1

## 2016-07-29 MED ORDER — MENTHOL 3 MG MT LOZG: 1.0000 | LOZENGE | OROMUCOSAL | Status: DC | PRN

## 2016-07-29 MED ORDER — MIDAZOLAM HCL 2 MG/2ML IJ SOLN
INTRAMUSCULAR | Status: AC
  Filled 2016-07-29: qty 2

## 2016-07-29 MED ORDER — DOCUSATE SODIUM 100 MG PO CAPS
100.0000 mg | ORAL_CAPSULE | Freq: Two times a day (BID) | ORAL | Status: DC
  Administered 2016-07-29 – 2016-07-30 (×3): 100 mg via ORAL
  Filled 2016-07-29 (×3): qty 1

## 2016-07-29 SURGICAL SUPPLY — 53 items
BAG DECANTER FOR FLEXI CONT (MISCELLANEOUS) ×2 IMPLANT
BLADE SAG 18X100X1.27 (BLADE) IMPLANT
BLADE SAW SGTL 18X1.27X75 (BLADE) ×1 IMPLANT
BLADE SAW SGTL 18X1.27X75MM (BLADE) ×1
CAPT HIP TOTAL 2 ×2 IMPLANT
CLOSURE STERI-STRIP 1/2X4 (GAUZE/BANDAGES/DRESSINGS) ×1
CLSR STERI-STRIP ANTIMIC 1/2X4 (GAUZE/BANDAGES/DRESSINGS) ×2 IMPLANT
COVER PERINEAL POST (MISCELLANEOUS) ×3 IMPLANT
COVER SURGICAL LIGHT HANDLE (MISCELLANEOUS) ×3 IMPLANT
DECANTER SPIKE VIAL GLASS SM (MISCELLANEOUS) ×2 IMPLANT
DRAPE C-ARM 42X72 X-RAY (DRAPES) ×3 IMPLANT
DRAPE STERI IOBAN 125X83 (DRAPES) ×3 IMPLANT
DRAPE U-SHAPE 47X51 STRL (DRAPES) ×2 IMPLANT
DRSG MEPILEX BORDER 4X8 (GAUZE/BANDAGES/DRESSINGS) ×3 IMPLANT
DURAPREP 26ML APPLICATOR (WOUND CARE) ×3 IMPLANT
ELECT BLADE 4.0 EZ CLEAN MEGAD (MISCELLANEOUS) ×3
ELECT REM PT RETURN 9FT ADLT (ELECTROSURGICAL) ×3
ELECTRODE BLDE 4.0 EZ CLN MEGD (MISCELLANEOUS) ×1 IMPLANT
ELECTRODE REM PT RTRN 9FT ADLT (ELECTROSURGICAL) ×1 IMPLANT
FACESHIELD WRAPAROUND (MASK) ×6 IMPLANT
FACESHIELD WRAPAROUND OR TEAM (MASK) ×2 IMPLANT
GLOVE BIO SURGEON STRL SZ7.5 (GLOVE) ×6 IMPLANT
GLOVE BIOGEL PI IND STRL 8 (GLOVE) ×2 IMPLANT
GLOVE BIOGEL PI INDICATOR 8 (GLOVE) ×4
GOWN STRL REUS W/ TWL LRG LVL3 (GOWN DISPOSABLE) ×2 IMPLANT
GOWN STRL REUS W/TWL LRG LVL3 (GOWN DISPOSABLE) ×12
KIT BASIN OR (CUSTOM PROCEDURE TRAY) ×3 IMPLANT
KIT ROOM TURNOVER OR (KITS) ×3 IMPLANT
MANIFOLD NEPTUNE II (INSTRUMENTS) ×3 IMPLANT
NDL SAFETY ECLIPSE 18X1.5 (NEEDLE) IMPLANT
NEEDLE HYPO 18GX1.5 SHARP (NEEDLE) ×3
NEEDLE HYPO 22GX1.5 SAFETY (NEEDLE) ×3 IMPLANT
NS IRRIG 1000ML POUR BTL (IV SOLUTION) ×3 IMPLANT
PACK TOTAL JOINT (CUSTOM PROCEDURE TRAY) ×3 IMPLANT
PACK UNIVERSAL I (CUSTOM PROCEDURE TRAY) ×3 IMPLANT
PAD ARMBOARD 7.5X6 YLW CONV (MISCELLANEOUS) ×3 IMPLANT
SPONGE LAP 18X18 X RAY DECT (DISPOSABLE) IMPLANT
SUT MNCRL AB 4-0 PS2 18 (SUTURE) ×3 IMPLANT
SUT MON AB 2-0 CT1 36 (SUTURE) ×3 IMPLANT
SUT VIC AB 0 CT1 27 (SUTURE) ×6
SUT VIC AB 0 CT1 27XBRD ANBCTR (SUTURE) ×1 IMPLANT
SUT VIC AB 1 CT1 27 (SUTURE) ×6
SUT VIC AB 1 CT1 27XBRD ANBCTR (SUTURE) ×1 IMPLANT
SYR 50ML LL SCALE MARK (SYRINGE) ×3 IMPLANT
SYRINGE 20CC LL (MISCELLANEOUS) IMPLANT
TOWEL OR 17X24 6PK STRL BLUE (TOWEL DISPOSABLE) ×3 IMPLANT
TOWEL OR 17X26 10 PK STRL BLUE (TOWEL DISPOSABLE) ×3 IMPLANT
TRAY CATH 16FR W/PLASTIC CATH (SET/KITS/TRAYS/PACK) ×2 IMPLANT
TRAY FOLEY CATH 16FRSI W/METER (SET/KITS/TRAYS/PACK) IMPLANT
TUBE CONNECTING 12'X1/4 (SUCTIONS) ×1
TUBE CONNECTING 12X1/4 (SUCTIONS) ×1 IMPLANT
WATER STERILE IRR 1000ML POUR (IV SOLUTION) ×3 IMPLANT
YANKAUER SUCT BULB TIP NO VENT (SUCTIONS) ×3 IMPLANT

## 2016-07-29 NOTE — Anesthesia Preprocedure Evaluation (Signed)
Anesthesia Evaluation  Patient identified by MRN, date of birth, ID band Patient awake    Reviewed: Allergy & Precautions, NPO status , Patient's Chart, lab work & pertinent test results  Airway Mallampati: I  TM Distance: >3 FB Neck ROM: Full    Dental   Pulmonary    Pulmonary exam normal        Cardiovascular hypertension, Pt. on medications Normal cardiovascular exam     Neuro/Psych    GI/Hepatic   Endo/Other    Renal/GU Renal diseasePt has H/O Proteinuria with stable Creatinine     Musculoskeletal   Abdominal   Peds  Hematology   Anesthesia Other Findings   Reproductive/Obstetrics                             Anesthesia Physical Anesthesia Plan  ASA: II  Anesthesia Plan: Spinal and MAC   Post-op Pain Management:    Induction: Intravenous  Airway Management Planned: Simple Face Mask  Additional Equipment:   Intra-op Plan:   Post-operative Plan:   Informed Consent: I have reviewed the patients History and Physical, chart, labs and discussed the procedure including the risks, benefits and alternatives for the proposed anesthesia with the patient or authorized representative who has indicated his/her understanding and acceptance.     Plan Discussed with: CRNA and Surgeon  Anesthesia Plan Comments:         Anesthesia Quick Evaluation

## 2016-07-29 NOTE — Anesthesia Postprocedure Evaluation (Signed)
Anesthesia Post Note  Patient: Helen Rogers  Procedure(s) Performed: Procedure(s) (LRB): TOTAL HIP ARTHROPLASTY ANTERIOR APPROACH (Right)  Patient location during evaluation: PACU Anesthesia Type: Spinal Level of consciousness: oriented and awake and alert Pain management: pain level controlled Vital Signs Assessment: post-procedure vital signs reviewed and stable Respiratory status: spontaneous breathing, respiratory function stable and patient connected to nasal cannula oxygen Cardiovascular status: blood pressure returned to baseline and stable Postop Assessment: no headache and no backache Anesthetic complications: no       Last Vitals:  Vitals:   07/29/16 1140 07/29/16 1155  BP: 123/80 118/79  Pulse: (!) 55 (!) 56  Resp: 11 11  Temp:      Last Pain:  Vitals:   07/29/16 0611  TempSrc: Oral                 [name] DAVID

## 2016-07-29 NOTE — Discharge Instructions (Signed)
INSTRUCTIONS AFTER JOINT REPLACEMENT  ° °o Remove items at home which could result in a fall. This includes throw rugs or furniture in walking pathways °o ICE to the affected joint every three hours while awake for 30 minutes at a time, for at least the first 3-5 days, and then as needed for pain and swelling.  Continue to use ice for pain and swelling. You may notice swelling that will progress down to the foot and ankle.  This is normal after surgery.  Elevate your leg when you are not up walking on it.   °o Continue to use the breathing machine you got in the hospital (incentive spirometer) which will help keep your temperature down.  It is common for your temperature to cycle up and down following surgery, especially at night when you are not up moving around and exerting yourself.  The breathing machine keeps your lungs expanded and your temperature down. ° ° °DIET:  As you were doing prior to hospitalization, we recommend a well-balanced diet. ° °DRESSING / WOUND CARE / SHOWERING ° °Keep the surgical dressing until follow up.  IF THE DRESSING FALLS OFF or the wound gets wet inside, change the dressing with sterile gauze.  Please use good hand washing techniques before changing the dressing.  Do not use any lotions or creams on the incision until instructed by your surgeon.   ° °ACTIVITY ° °o Increase activity slowly as tolerated, but follow the weight bearing instructions below.   °o No driving for 6 weeks or until further direction given by your physician.  You cannot drive while taking narcotics.  °o No lifting or carrying greater than 10 lbs. until further directed by your surgeon. °o Avoid periods of inactivity such as sitting longer than an hour when not asleep. This helps prevent blood clots.  °o You may return to work once you are authorized by your doctor.  ° ° ° °WEIGHT BEARING  ° °Weight bearing as tolerated with assist device (walker, cane, etc) as directed, use it as long as suggested by your  surgeon or therapist, typically at least 4-6 weeks. ° ° °EXERCISES ° °Results after joint replacement surgery are often greatly improved when you follow the exercise, range of motion and muscle strengthening exercises prescribed by your doctor. Safety measures are also important to protect the joint from further injury. Any time any of these exercises cause you to have increased pain or swelling, decrease what you are doing until you are comfortable again and then slowly increase them. If you have problems or questions, call your caregiver or physical therapist for advice.  ° °Rehabilitation is important following a joint replacement. After just a few days of immobilization, the muscles of the leg can become weakened and shrink (atrophy).  These exercises are designed to build up the tone and strength of the thigh and leg muscles and to improve motion. Often times heat used for twenty to thirty minutes before working out will loosen up your tissues and help with improving the range of motion but do not use heat for the first two weeks following surgery (sometimes heat can increase post-operative swelling).  ° °These exercises can be done on a training (exercise) mat, on the floor, on a table or on a bed. Use whatever works the best and is most comfortable for you.    Use music or television while you are exercising so that the exercises are a pleasant break in your day. This will make your life   better with the exercises acting as a break in your routine that you can look forward to.   Perform all exercises about fifteen times, three times per day or as directed.  You should exercise both the operative leg and the other leg as well. ° °Exercises include: °  °• Quad Sets - Tighten up the muscle on the front of the thigh (Quad) and hold for 5-10 seconds.   °• Straight Leg Raises - With your knee straight (if you were given a brace, keep it on), lift the leg to 60 degrees, hold for 3 seconds, and slowly lower the leg.   Perform this exercise against resistance later as your leg gets stronger.  °• Leg Slides: Lying on your back, slowly slide your foot toward your buttocks, bending your knee up off the floor (only go as far as is comfortable). Then slowly slide your foot back down until your leg is flat on the floor again.  °• Angel Wings: Lying on your back spread your legs to the side as far apart as you can without causing discomfort.  °• Hamstring Strength:  Lying on your back, push your heel against the floor with your leg straight by tightening up the muscles of your buttocks.  Repeat, but this time bend your knee to a comfortable angle, and push your heel against the floor.  You may put a pillow under the heel to make it more comfortable if necessary.  ° °A rehabilitation program following joint replacement surgery can speed recovery and prevent re-injury in the future due to weakened muscles. Contact your doctor or a physical therapist for more information on knee rehabilitation.  ° ° °CONSTIPATION ° °Constipation is defined medically as fewer than three stools per week and severe constipation as less than one stool per week.  Even if you have a regular bowel pattern at home, your normal regimen is likely to be disrupted due to multiple reasons following surgery.  Combination of anesthesia, postoperative narcotics, change in appetite and fluid intake all can affect your bowels.  ° °YOU MUST use at least one of the following options; they are listed in order of increasing strength to get the job done.  They are all available over the counter, and you may need to use some, POSSIBLY even all of these options:   ° °Drink plenty of fluids (prune juice may be helpful) and high fiber foods °Colace 100 mg by mouth twice a day  °Senokot for constipation as directed and as needed Dulcolax (bisacodyl), take with full glass of water  °Miralax (polyethylene glycol) once or twice a day as needed. ° °If you have tried all these things and  are unable to have a bowel movement in the first 3-4 days after surgery call either your surgeon or your primary doctor.   ° °If you experience loose stools or diarrhea, hold the medications until you stool forms back up.  If your symptoms do not get better within 1 week or if they get worse, check with your doctor.  If you experience "the worst abdominal pain ever" or develop nausea or vomiting, please contact the office immediately for further recommendations for treatment. ° ° °ITCHING:  If you experience itching with your medications, try taking only a single pain pill, or even half a pain pill at a time.  You can also use Benadryl over the counter for itching or also to help with sleep.  ° °TED HOSE STOCKINGS:  Use stockings on both legs   until for at least 2 weeks or as directed by physician office. They may be removed at night for sleeping.  MEDICATIONS:  See your medication summary on the After Visit Summary that nursing will review with you.  You may have some home medications which will be placed on hold until you complete the course of blood thinner medication.  It is important for you to complete the blood thinner medication as prescribed.  PRECAUTIONS:  If you experience chest pain or shortness of breath - call 911 immediately for transfer to the hospital emergency department.   If you develop a fever greater that 101 F, purulent drainage from wound, increased redness or drainage from wound, foul odor from the wound/dressing, or calf pain - CONTACT YOUR SURGEON.                                                   FOLLOW-UP APPOINTMENTS:  If you do not already have a post-op appointment, please call the office for an appointment to be seen by your surgeon.  Guidelines for how soon to be seen are listed in your After Visit Summary, but are typically between 1-4 weeks after surgery.  OTHER INSTRUCTIONS:   Knee Replacement:  Do not place pillow under knee, focus on keeping the knee straight  while resting. CPM instructions: 0-90 degrees, 2 hours in the morning, 2 hours in the afternoon, and 2 hours in the evening. Place foam block, curve side up under heel at all times except when in CPM or when walking.  DO NOT modify, tear, cut, or change the foam block in any way.  MAKE SURE YOU:   Understand these instructions.   Get help right away if you are not doing well or get worse.    Thank you for letting us be a part of your medical care team.  It is a privilege we respect greatly.  We hope these instructions will help you stay on track for a fast and full recovery!    Information on my medicine - XARELTO (Rivaroxaban)  This medication education was reviewed with me or my healthcare representative as part of my discharge preparation.  The pharmacist that spoke with me during my hospital stay was:  [facility], Margot Chimes, [facility]  Why was Xarelto prescribed for you? Xarelto was prescribed for you to reduce the risk of blood clots forming after orthopedic surgery. The medical term for these abnormal blood clots is venous thromboembolism (VTE).  What do you need to know about xarelto ? Take your Xarelto ONCE DAILY at the same time every day. You may take it either with or without food.  If you have difficulty swallowing the tablet whole, you may crush it and mix in applesauce just prior to taking your dose.  Take Xarelto exactly as prescribed by your doctor and DO NOT stop taking Xarelto without talking to the doctor who prescribed the medication.  Stopping without other VTE prevention medication to take the place of Xarelto may increase your risk of developing a clot.  After discharge, you should have regular check-up appointments with your healthcare provider that is prescribing your Xarelto.    What do you do if you miss a dose? If you miss a dose, take it as soon as you remember on the same day then continue your regularly scheduled once daily regimen  the next day. Do  not take two doses of Xarelto on the same day.   Important Safety Information A possible side effect of Xarelto is bleeding. You should call your healthcare provider right away if you experience any of the following: ? Bleeding from an injury or your nose that does not stop. ? Unusual colored urine (red or dark brown) or unusual colored stools (red or black). ? Unusual bruising for unknown reasons. ? A serious fall or if you hit your head (even if there is no bleeding).  Some medicines may interact with Xarelto and might increase your risk of bleeding while on Xarelto. To help avoid this, consult your healthcare provider or pharmacist prior to using any new prescription or non-prescription medications, including herbals, vitamins, non-steroidal anti-inflammatory drugs (NSAIDs) and supplements.  This website has more information on Xarelto: [email or web address]

## 2016-07-29 NOTE — Op Note (Signed)
07/29/2016  8:55 AM  PATIENT:  Helen Rogers   MRN: [id number]  PRE-OPERATIVE DIAGNOSIS:  OA RIGHT HIP  POST-OPERATIVE DIAGNOSIS:  OA RIGHT HIP  PROCEDURE:  Procedure(s): TOTAL HIP ARTHROPLASTY ANTERIOR APPROACH  PREOPERATIVE INDICATIONS:    Helen Rogers is an 51 y.o. female who has a diagnosis of Primary osteoarthritis of right hip and elected for surgical management after failing conservative treatment.  The risks benefits and alternatives were discussed with the patient including but not limited to the risks of nonoperative treatment, versus surgical intervention including infection, bleeding, nerve injury, periprosthetic fracture, the need for revision surgery, dislocation, leg length discrepancy, blood clots, cardiopulmonary complications, morbidity, mortality, among others, and they were willing to proceed.     OPERATIVE REPORT     SURGEON:   MURPHY, TIMOTHY D, MD    ASSISTANT:  Henry Martensen, PA-C, he was present and scrubbed throughout the case, critical for completion in a timely fashion, and for retraction, instrumentation, and closure.     ANESTHESIA:  General    COMPLICATIONS:  None.     COMPONENTS:  Stryker acolade fit femur size 4 with a 36 mm -2.5 head ball and a PSL acetabular shell size 48 with a  polyethylene liner    PROCEDURE IN DETAIL:   The patient was met in the holding area and  identified.  The appropriate hip was identified and marked at the operative site.  The patient was then transported to the OR  and  placed under anesthesia per that record.  At that point, the patient was  placed in the supine position and  secured to the operating room table and all bony prominences padded. He received pre-operative antibiotics    The operative lower extremity was prepped from the iliac crest to the distal leg.  Sterile draping was performed.  Time out was performed prior to incision.      Skin incision was made just 2 cm lateral to the ASIS  extending in line  with the tensor fascia lata. Electrocautery was used to control all bleeders. I dissected down sharply to the fascia of the tensor fascia lata was confirmed that the muscle fibers beneath were running posteriorly. I then incised the fascia over the superficial tensor fascia lata in line with the incision. The fascia was elevated off the anterior aspect of the muscle the muscle was retracted posteriorly and protected throughout the case. I then used electrocautery to incise the tensor fascia lata fascia control and all bleeders. Immediately visible was the fat over top of the anterior neck and capsule.  I removed the anterior fat from the capsule and elevated the rectus muscle off of the anterior capsule. I then removed a large time of capsule. The retractors were then placed over the anterior acetabulum as well as around the superior and inferior neck.  I then removed a section of the femoral neck and a napkin ring fashion. Then used the power course to remove the femoral head from the acetabulum and thoroughly irrigated the acetabulum. I sized the femoral head.    I then exposed the deep acetabulum, cleared out any tissue including the ligamentum teres.   After adequate visualization, I excised the labrum, and then sequentially reamed.  I then impacted the acetabular implant into place using fluoroscopy for guidance.  Appropriate version and inclination was confirmed clinically matching their bony anatomy, and with fluoroscopy.  I placed a 20mm screw in the posterior/superio position with an excellent bite.      I then placed the polyethylene liner in place  I then adducted the leg and released the external rotators from the posterior femur allowing it to be easily delivered up lateral and anterior to the acetabulum for preparation of the femoral canal.    I then prepared the proximal femur using the cookie-cutter and then sequentially reamed and broached.  A trial broach, neck, and head was utilized,  and I reduced the hip and used floroscopy to assess the neck length and femoral implant.  I then impacted the femoral prosthesis into place into the appropriate version. The hip was then reduced and fluoroscopy confirmed appropriate position. Leg lengths were restored.  I then irrigated the hip copiously again with, and repaired the fascia with Vicryl, followed by monocryl for the subcutaneous tissue, Monocryl for the skin, Steri-Strips and sterile gauze. The patient was then awakened and returned to PACU in stable and satisfactory condition. There were no complications.  POST OPERATIVE PLAN: WBAT, DVT px: SCD's/TED, ambulation and chemical dvt px  Timothy Murphy, MD Orthopedic Surgeon [phone]     

## 2016-07-29 NOTE — Transfer of Care (Signed)
Immediate Anesthesia Transfer of Care Note  Patient: Helen Rogers  Procedure(s) Performed: Procedure(s): TOTAL HIP ARTHROPLASTY ANTERIOR APPROACH (Right)  Patient Location: PACU  Anesthesia Type:Spinal  Level of Consciousness: awake, alert  and patient cooperative  Airway & Oxygen Therapy: Patient Spontanous Breathing and Patient connected to nasal cannula oxygen  Post-op Assessment: Report given to RN, Post -op Vital signs reviewed and stable and Patient able to stick tongue midline  Post vital signs: Reviewed and stable  Last Vitals:  Vitals:   07/29/16 0611 07/29/16 0623  BP: (!) 141/113 136/84  Pulse: 75   Resp: 20   Temp: 36.7 C     Last Pain:  Vitals:   07/29/16 0611  TempSrc: Oral         Complications: No apparent anesthesia complications

## 2016-07-29 NOTE — Anesthesia Procedure Notes (Addendum)
Spinal  Start time: 07/29/2016 7:40 AM End time: 07/29/2016 7:44 AM Staffing Anesthesiologist: Lillia Abed Performed: anesthesiologist  Preanesthetic Checklist Completed: patient identified, surgical consent, pre-op evaluation, timeout performed, IV checked, risks and benefits discussed and monitors and equipment checked Spinal Block Patient position: sitting Prep: Betadine Patient monitoring: heart rate, cardiac monitor, continuous pulse ox and blood pressure Approach: right paramedian Location: L3-4 Injection technique: single-shot Needle Needle type: Pencan  Needle gauge: 24 G Needle length: 9 cm Needle insertion depth: 7 cm

## 2016-07-29 NOTE — Interval H&P Note (Signed)
History and Physical Interval Note:  07/29/2016 7:31 AM  Helen Rogers  has presented today for surgery, with the diagnosis of OA RIGHT HIP  The various methods of treatment have been discussed with the patient and family. After consideration of risks, benefits and other options for treatment, the patient has consented to  Procedure(s): TOTAL HIP ARTHROPLASTY ANTERIOR APPROACH (Right) as a surgical intervention .  The patient's history has been reviewed, patient examined, no change in status, stable for surgery.  I have reviewed the patient's chart and labs.  Questions were answered to the patient's satisfaction.     [name]

## 2016-07-29 NOTE — Evaluation (Signed)
Physical Therapy Evaluation Patient Details Name: Helen Rogers MRN: [id number] DOB: 07/04/1966 Today's Date: 07/29/2016   History of Present Illness  Admitted for RTHA, Direct Anterior approach, WBAT;  has a past medical history of Arthritis; CKD (chronic kidney disease); FSGS (focal segmental glomerulosclerosis); History of shingles; Hypertension; Insomnia; Joint pain; Nocturia; and Spinal headache (2002).   Clinical Impression   Pt is s/p THA resulting in the deficits listed below (see PT Problem List). Mobilized and walked very well today; Will need to go over stair training tomorrow; DC home tomorrow is quite reasonable;  Pt will benefit from skilled PT to increase their independence and safety with mobility to allow discharge to the venue listed below.    Follow Up Recommendations Home health PT;Supervision/Assistance - 24 hour    Equipment Recommendations  Rolling walker with 5" wheels;3in1 (PT) (perhaps shower seat; will defer to OT)    Recommendations for Other Services       Precautions / Restrictions Precautions Precautions: None Restrictions Weight Bearing Restrictions: Yes RLE Weight Bearing: Weight bearing as tolerated      Mobility  Bed Mobility Overal bed mobility: Needs Assistance Bed Mobility: Supine to Sit     Supine to sit: Min guard     General bed mobility comments: Cues for technique  Transfers Overall transfer level: Needs assistance Equipment used: Rolling walker (2 wheeled) Transfers: Sit to/from Stand Sit to Stand: Min guard         General transfer comment: Cues for hand placement and control; good rise and good weight bearing on RLE  Ambulation/Gait Ambulation/Gait assistance: Min guard (without physical contact) Ambulation Distance (Feet): 100 Feet Assistive device: Rolling walker (2 wheeled) Gait Pattern/deviations: Step-through pattern Gait velocity: decr Gait velocity interpretation: Below normal speed for age/gender General  Gait Details: Cues for posture, gait sequence, and to self-monitor for activity tolerance  Stairs            Wheelchair Mobility    Modified Rankin (Stroke Patients Only)       Balance Overall balance assessment: Needs assistance   Sitting balance-Leahy Scale: Good       Standing balance-Leahy Scale: Fair                               Pertinent Vitals/Pain Pain Assessment: No/denies pain    Home Living Family/patient expects to be discharged to:: Private residence Living Arrangements: Spouse/significant other;Children Available Help at Discharge: Family;Available 24 hours/day Type of Home: House Home Access: Stairs to enter Entrance Stairs-Rails: Psychiatric nurse of Steps: 5 Home Layout: Two level;Bed/bath upstairs Home Equipment: None      Prior Function Level of Independence: Independent         Comments: Works from home; 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, newspaper   Dominant Hand: Right    Extremity/Trunk Assessment   Upper Extremity Assessment Upper Extremity Assessment: Overall WFL for tasks assessed    Lower Extremity Assessment Lower Extremity Assessment: RLE deficits/detail RLE Deficits / Details: Noting good hip control with bed exercises       Communication   Communication: No difficulties  Cognition Arousal/Alertness: Awake/alert Behavior During Therapy: WFL for tasks assessed/performed Overall Cognitive Status: Within Functional Limits for tasks assessed                      General Comments      Exercises Total Joint Exercises Quad  Sets: AROM;Right;5 reps Gluteal Sets: AROM;Both;5 reps Towel Squeeze: AROM;Both;5 reps Heel Slides: AAROM;Right;5 reps Hip ABduction/ADduction: AAROM;Right;5 reps   Assessment/Plan    PT Assessment Patient needs continued PT services  PT Problem List Decreased strength;Decreased range of motion;Decreased activity tolerance;Decreased  mobility;Pain;Decreased knowledge of use of DME          PT Treatment Interventions DME instruction;Gait training;Stair training;Functional mobility training;Therapeutic activities;Therapeutic exercise;Patient/family education    PT Goals (Current goals can be found in the Care Plan section)  Acute Rehab PT Goals Patient Stated Goal: Be able to stand up and walk without pain, workout, return to [employer] PT Goal Formulation: With patient Time For Goal Achievement: 08/05/16 Potential to Achieve Goals: Good    Frequency 7X/week   Barriers to discharge        Co-evaluation               End of Session Equipment Utilized During Treatment: Gait belt Activity Tolerance: Patient tolerated treatment well Patient left: in chair;with call bell/phone within reach Nurse Communication: Mobility status         Time: [id number] PT Time Calculation (min) (ACUTE ONLY): 35 min   Charges:   PT Evaluation $PT Eval Low Complexity: 1 Procedure PT Treatments $Gait Training: 8-22 mins   PT G Codes:        [name] 07/29/2016, 3:28 PM  [name], [place] Pager [phone] Office [phone]

## 2016-07-30 ENCOUNTER — Encounter (HOSPITAL_COMMUNITY): Payer: Self-pay

## 2016-07-30 NOTE — Care Management Note (Signed)
Case Management Note  Patient Details  Name: Helen Rogers MRN: [id number] Date of Birth: 1966/05/24  Subjective/Objective:   52 yr old female s/p right total hip arthroplasty.                 Action/Plan: Case manager spoke with patient concerning [place] and DME needs. Patient was preoperatively setup with [facility], no changes. Patient states her husband will assist her at discharge. Rolling Walker and 3in1 have been delivered to patient's room.    Expected Discharge Date:   07/30/16               Expected Discharge Plan:  [place]  In-House Referral:  NA  Discharge planning Services  CM Consult  Post Acute Care Choice:  Home Health, Durable Medical Equipment Choice offered to:  Patient  DME Arranged:  Walker rolling DME Agency:  [employer]  HH Arranged:  PT [place]:  [facility] [employer]  Status of Service:  Completed, signed off  If discussed at [employer], dates discussed:    Additional Comments:  Ninfa Meeker, RN 07/30/2016, 11:12 AM

## 2016-07-30 NOTE — Progress Notes (Signed)
Physical Therapy Treatment Patient Details Name: Helen Rogers MRN: [id number] DOB: [birth year] Today's Date: 07/30/2016    History of Present Illness Admitted for RTHA, Direct Anterior approach, WBAT;  has a past medical history of Arthritis; CKD (chronic kidney disease); FSGS (focal segmental glomerulosclerosis); History of shingles; Hypertension; Insomnia; Joint pain; Nocturia; and Spinal headache (2002).     PT Comments    Making excellent progress with mobility and gait; Walked with RW and cane, Helen Rogers is going to contact a neighbor to see about possibly borrowing a cane; We discussed typical progression, increasing activity (curious about going to a high school basketball game next week); HHPT can still be beneficial to progress therex and give more specific ideas for managing at home; it would be a short [place] course, and I value her getting to Outpt PT for strengthening and further gait training, and transition back to exercise at gym.  Will pan to focus on therex and answering any questions next session in prep for going home today.  Follow Up Recommendations  Home health PT;Supervision/Assistance - 24 hour (followed by Outpt PT)     Equipment Recommendations  Rolling walker with 5" wheels;3in1 (PT)    Recommendations for Other Services       Precautions / Restrictions Precautions Precautions: None Restrictions Weight Bearing Restrictions: Yes RLE Weight Bearing: Weight bearing as tolerated    Mobility  Bed Mobility Overal bed mobility: (P) Needs Assistance Bed Mobility: (P) Supine to Sit     Supine to sit: (P) Supervision     General bed mobility comments: (P) Supervision for safety.  Transfers Overall transfer level: Needs assistance Equipment used: Rolling walker (2 wheeled) Transfers: Sit to/from Stand Sit to Stand: Supervision         General transfer comment: No need to cue for hand placement  Ambulation/Gait Ambulation/Gait assistance:  Supervision Ambulation Distance (Feet): 350 Feet Assistive device: Rolling walker (2 wheeled);Straight cane Gait Pattern/deviations: Step-through pattern;Decreased stance time - right Gait velocity: decr   General Gait Details: Cues for posture, gait sequence, and to self-monitor for activity tolerance; good use of cane as well as RW   Stairs Stairs: Yes   Stair Management: One rail Left;Step to pattern;Forwards Number of Stairs: 12 General stair comments: verbal and demo cues for sequence  Wheelchair Mobility    Modified Rankin (Stroke Patients Only)       Balance Overall balance assessment: (P) Needs assistance   Sitting balance-Leahy Scale: Good       Standing balance-Leahy Scale: Fair                      Cognition Arousal/Alertness: Awake/alert Behavior During Therapy: WFL for tasks assessed/performed Overall Cognitive Status: Within Functional Limits for tasks assessed                      Exercises      General Comments        Pertinent Vitals/Pain Pain Assessment: No/denies pain    Home Living Family/patient expects to be discharged to:: Private residence Living Arrangements: Spouse/significant other;Children Available Help at Discharge: Family;Available 24 hours/day Type of Home: House Home Access: Stairs to enter Entrance Stairs-Rails: Right;Left Home Layout: Two level;Bed/bath upstairs Home Equipment: None      Prior Function Level of Independence: Independent      Comments: Works from home; 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   PT Goals (current goals can now be found in the care plan section)  Acute Rehab PT Goals Patient Stated Goal: Be able to stand up and walk without pain, workout, return to [employer] PT Goal Formulation: With patient Time For Goal Achievement: 08/05/16 Potential to Achieve Goals: Good Progress towards PT goals: Progressing toward goals    Frequency    7X/week      PT Plan Current plan  remains appropriate    Co-evaluation             End of Session   Activity Tolerance: Patient tolerated treatment well Patient left: in chair;with call bell/phone within reach     Time: 0852-0920 PT Time Calculation (min) (ACUTE ONLY): 28 min  Charges:  $Gait Training: 23-37 mins                    G Codes:      Helen Rogers 08/24/2016, 9:32 AM  [name], [place] Pager [phone] Office [phone]

## 2016-07-30 NOTE — Discharge Summary (Signed)
Discharge Summary  Patient ID: Helen Rogers MRN: [id number] DOB/AGE: Oct 22, 1965 51 y.o.  Admit date: 07/29/2016 Discharge date: 07/30/2016  Admission Diagnoses:  Primary osteoarthritis of right hip  Discharge Diagnoses:  Principal Problem:   Primary osteoarthritis of right hip Active Problems:   Essential hypertension   Chronic kidney disease   Past Medical History:  Diagnosis Date  . Arthritis   . 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 Marval Regal  . FSGS (focal segmental glomerulosclerosis)   . History of shingles   . Hypertension    takes Lisinopril daily  . Insomnia    takes Melatonin nightly  . Joint pain   . Nocturia   . Spinal headache 2002   required blood patch    Surgeries: Procedure(s): TOTAL HIP ARTHROPLASTY ANTERIOR APPROACH on 07/29/2016   Consultants (if any):   Discharged Condition: Improved  Progress  Subjective: Feeling good.  OOB walking in hall w/ PT.  Pain controlled with PO meds.  Tolerating diet.  Urinating.  No CP, SOB.  Objective: General: NAD.   Resp: No increased WOB Cardio: regular rate and rhythm ABD soft Neurologically intact MSK Neurovascularly intact Sensation intact distally Feet warm Dorsiflexion/Plantar flexion intact Incision: dressing C/D/I  Plan: Up with therapy Weight Bearing: Weight Bearing as Tolerated (WBAT)  Dressings: prn.  VTE prophylaxis: Xarelto, ambulation, SCDs Dispo: Home  today  Hospital Course: Helen Rogers is an 51 y.o. female who was admitted 07/29/2016 with a diagnosis of Primary osteoarthritis of right hip and went to the operating room on 07/29/2016 and underwent the above named procedures.    She was given perioperative antibiotics:  Anti-infectives    Start     Dose/Rate Route Frequency Ordered Stop   07/29/16 1400  ceFAZolin (ANCEF) IVPB 2g/100 mL premix     2 g 200 mL/hr over 30 Minutes Intravenous Every 6 hours 07/29/16 1228 07/29/16 2031   07/29/16 0700   ceFAZolin (ANCEF) IVPB 2g/100 mL premix     2 g 200 mL/hr over 30 Minutes Intravenous On call to O.R. 07/28/16 0930 07/29/16 0745    .  She was given sequential compression devices, early ambulation, and Xarelto for DVT prophylaxis.  She benefited maximally from the hospital stay and there were no complications.    Recent vital signs:  Vitals:   07/29/16 2345 07/30/16 0518  BP: 138/67 131/62  Pulse: 86 81  Resp: 16 16  Temp: 98.2 F (36.8 C) 98.4 F (36.9 C)    Recent laboratory studies:  Lab Results  Component Value Date   HGB 14.0 07/17/2016   HGB 11.6 (L) 12/03/2009   HGB 9.9 (L) 11/19/2009   Lab Results  Component Value Date   WBC 7.5 07/17/2016   PLT 266 07/17/2016   Lab Results  Component Value Date   INR 0.89 07/17/2016   Lab Results  Component Value Date   NA 140 07/17/2016   K 4.3 07/17/2016   CL 105 07/17/2016   CO2 27 07/17/2016   BUN 14 07/17/2016   CREATININE 1.32 (H) 07/17/2016   GLUCOSE 90 07/17/2016    Discharge Medications:   Allergies as of 07/30/2016      Reactions   Oxycodone Hcl Itching   Hallucinations       Medication List    TAKE these medications   acetaminophen 500 MG tablet Commonly known as:  TYLENOL Take 1,000 mg by mouth every 6 (six) hours as needed for moderate pain or  headache.   calcium carbonate 500 MG chewable tablet Commonly known as:  TUMS - dosed in mg elemental calcium Chew 3 tablets by mouth daily as needed for indigestion or heartburn.   docusate sodium 100 MG capsule Commonly known as:  COLACE Take 1 capsule (100 mg total) by mouth 2 (two) times daily. To prevent constipation while taking pain medication.   HYDROcodone-acetaminophen 5-325 MG tablet Commonly known as:  NORCO Take 1-2 tablets by mouth every 4 (four) hours as needed for moderate pain.   ibuprofen 200 MG tablet Commonly known as:  ADVIL,MOTRIN Take 400 mg by mouth every 6 (six) hours as needed for headache or moderate pain.   lisinopril  20 MG tablet Commonly known as:  PRINIVIL,ZESTRIL Take 20 mg by mouth at bedtime.   Melatonin 5 MG Chew Chew 10 mg by mouth at bedtime as needed (sleep).   methocarbamol 500 MG tablet Commonly known as:  ROBAXIN Take 1 tablet (500 mg total) by mouth every 6 (six) hours as needed for muscle spasms.   MULTI ADULT GUMMIES PO Take 2 tablets by mouth daily.   ondansetron 4 MG tablet Commonly known as:  ZOFRAN Take 1 tablet (4 mg total) by mouth every 8 (eight) hours as needed for nausea or vomiting.   rivaroxaban 10 MG Tabs tablet Commonly known as:  XARELTO Take 1 tablet (10 mg total) by mouth daily. For 30 days for DVT prophylaxis   ZZZQUIL 50 MG/30ML Liqd Generic drug:  DiphenhydrAMINE HCl Take 15 mLs by mouth daily as needed (sleep).       Diagnostic Studies: Dg C-arm 1-60 Min  Result Date: 07/29/2016 CLINICAL DATA:  Right hip replacement EXAM: DG C-ARM 61-120 MIN; OPERATIVE RIGHT HIP WITH PELVIS COMPARISON:  None FLUOROSCOPY TIME:  14 seconds FINDINGS: Right total hip arthroplasty without failure or complication. No fracture or dislocation. 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 Electronically Signed   By: Kathreen Devoid   On: 07/29/2016 09:06   Dg Hip Operative Unilat W Or W/o Pelvis Right  Result Date: 07/29/2016 CLINICAL DATA:  Right hip replacement EXAM: DG C-ARM 61-120 MIN; OPERATIVE RIGHT HIP WITH PELVIS COMPARISON:  None FLUOROSCOPY TIME:  14 seconds FINDINGS: Right total hip arthroplasty without failure or complication. No fracture or dislocation. 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 Electronically Signed   By: Kathreen Devoid   On: 07/29/2016 09:06    Disposition:     Follow-up Information    MURPHY, Ernesta Amble, MD Follow up.   Specialty:  Orthopedic Surgery Contact information: [address] [place] [address] [phone]           Signed: Prudencio Burly III  PA-C 07/30/2016, 7:32 AM

## 2016-07-30 NOTE — Progress Notes (Addendum)
Occupational Therapy Evaluation/Discharge Patient Details Name: Helen Rogers MRN: [id number] DOB: 1965/10/18 Today's Date: 07/30/2016    History of Present Illness Admitted for RTHA, Direct Anterior approach, WBAT;  has a past medical history of Arthritis; CKD (chronic kidney disease); FSGS (focal segmental glomerulosclerosis); History of shingles; Hypertension; Insomnia; Joint pain; Nocturia; and Spinal headache (2002).    Clinical Impression   PTA, pt was independent with ADL and functional mobility and was maintaining an active lifestyle. Pt currently requires supervision for all ADL and functional mobility. Educated pt on compensatory dressing techniques as well as use of AE for LB ADL and pt able to complete LB dressing with supervision when using AE as compared to min assist without. Pt educated on fall prevention strategies and safe shower transfers as well as safe use of DME. She verbalizes and demonstrates understanding. Pt will have assistance/supervision intermittently at home from her husband and teenage children. No OT follow-up recommended and no further acute OT needs identified. Recommend 3-in-1 BSC for DME needs. OT will continue to follow acutely.     Follow Up Recommendations  No OT follow up;Supervision - Intermittent    Equipment Recommendations  3 in 1 bedside commode       Precautions / Restrictions Precautions Precautions: None Restrictions Weight Bearing Restrictions: Yes RLE Weight Bearing: Weight bearing as tolerated      Mobility Bed Mobility Overal bed mobility: Needs Assistance Bed Mobility: Supine to Sit     Supine to sit: Supervision     General bed mobility comments: Supervision for safety.  Transfers Overall transfer level: Needs assistance Equipment used: Rolling walker (2 wheeled) Transfers: Sit to/from Stand Sit to Stand: Supervision         General transfer comment: No need to cue for hand placement    Balance Overall balance  assessment: Needs assistance Sitting-balance support: No upper extremity supported;Feet supported Sitting balance-Leahy Scale: Good     Standing balance support: No upper extremity supported;During functional activity;Bilateral upper extremity supported Standing balance-Leahy Scale: Fair Standing balance comment: Able to complete short distance toilet transfer without RW. Requires RW for more complex ADL transfers such as shower transfers.                            ADL Overall ADL's : Needs assistance/impaired     Grooming: Supervision/safety;Standing   Upper Body Bathing: Set up;Sitting   Lower Body Bathing: Supervison/ safety;Sit to/from stand;With adaptive equipment   Upper Body Dressing : Set up;Sitting   Lower Body Dressing: Supervision/safety;With adaptive equipment;Sit to/from stand Lower Body Dressing Details (indicate cue type and reason): Required min assist for donning underwear without AE but able to don pajama pants with supervision using AE. Toilet Transfer: Supervision/safety;Ambulation;BSC (BSC over toilet; ambulating without RW for short distance)   Toileting- Clothing Manipulation and Hygiene: Supervision/safety;Sit to/from stand   Tub/ Shower Transfer: Supervision/safety;Walk-in shower;Ambulation;3 in 1;Rolling walker   Functional mobility during ADLs: Rolling walker;Supervision/safety General ADL Comments: Utilizing RW for shower transfer as well as longer distance ambulation but able to complete toilet transfer without RW. Educated on compensatory dressing techniques and use of AE for LB dressing.     Vision Vision Assessment?: No apparent visual deficits   Perception     Praxis      Pertinent Vitals/Pain Pain Assessment: 0-10 Pain Score: 3  Pain Location: R hip Pain Descriptors / Indicators: Sore Pain Intervention(s): Monitored during session;Repositioned;Ice applied     Hand Dominance  Right   Extremity/Trunk Assessment Upper  Extremity Assessment Upper Extremity Assessment: Overall WFL for tasks assessed   Lower Extremity Assessment Lower Extremity Assessment: Defer to PT evaluation       Communication Communication Communication: No difficulties   Cognition Arousal/Alertness: Awake/alert Behavior During Therapy: WFL for tasks assessed/performed Overall Cognitive Status: Within Functional Limits for tasks assessed                     General Comments       Exercises       Shoulder Instructions      Home Living Family/patient expects to be discharged to:: Private residence Living Arrangements: Spouse/significant other;Children Available Help at Discharge: Family;Available 24 hours/day Type of Home: House Home Access: Stairs to enter [employer] of Steps: 5 Entrance Stairs-Rails: Right;Left Home Layout: Two level;Bed/bath upstairs Alternate Level Stairs-Number of Steps: Flight   Bathroom Shower/Tub: Occupational psychologist: Standard     Home Equipment: None          Prior Functioning/Environment Level of Independence: Independent        Comments: Works from home; 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        OT Problem List: Decreased strength;Decreased range of motion;Decreased activity tolerance;Impaired balance (sitting and/or standing);Decreased safety awareness;Decreased knowledge of use of DME or AE;Decreased knowledge of precautions;Pain   OT Treatment/Interventions:      OT Goals(Current goals can be found in the care plan section) Acute Rehab OT Goals Patient Stated Goal: Be able to stand up and walk without pain, workout, return to [facility] OT Goal Formulation: With patient Time For Goal Achievement: 08/06/16 Potential to Achieve Goals: Good  OT Frequency:     Barriers to D/C:            Co-evaluation              End of Session Equipment Utilized During Treatment: Rolling walker  Activity Tolerance: Patient tolerated  treatment well Patient left: in chair;with call bell/phone within reach   Time: 0802-0841 OT Time Calculation (min): 39 min Charges:  OT General Charges $OT Visit: 1 Procedure OT Evaluation $OT Eval Moderate Complexity: 1 Procedure OT Treatments $Self Care/Home Management : 23-37 mins  Norman Herrlich, OTR/L [phone] 07/30/2016, 9:42 AM

## 2016-07-30 NOTE — Progress Notes (Signed)
Physical Therapy Treatment Patient Details Name: Helen Rogers MRN: [id number] DOB: [birth year] Today's Date: 1/3/Rogers    History of Present Illness Admitted for RTHA, Direct Anterior approach, WBAT;  has a past medical history of Arthritis; CKD (chronic kidney disease); FSGS (focal segmental glomerulosclerosis); History of shingles; Hypertension; Insomnia; Joint pain; Nocturia; and Spinal headache (2002).     PT Comments    Excellent progress with amb, stairs and therapeutic exercise; OK for dc home from PT standpoint   Follow Up Recommendations  Home health PT;Supervision/Assistance - 24 hour (Followed by Outpt pT)     Equipment Recommendations  Rolling walker with 5" wheels;3in1 (PT)    Recommendations for Other Services       Precautions / Restrictions Precautions Precautions: None Restrictions RLE Weight Bearing: Weight bearing as tolerated    Mobility  Bed Mobility Overal bed mobility: Needs Assistance Bed Mobility: Rolling;Sidelying to Sit Rolling: Supervision Sidelying to sit: Supervision       General bed mobility comments: Performed rolling technique to non-operative side to give pt more options for getting up; cues for technqiue; used stronger LLE to assist RLE into bed  Transfers Overall transfer level: Needs assistance Equipment used: Rolling walker (2 wheeled) Transfers: Sit to/from Stand Sit to Stand: Supervision         General transfer comment: No need to cue for hand placement  Ambulation/Gait Ambulation/Gait assistance: Supervision Ambulation Distance (Feet): 400 Feet Assistive device: Rolling walker (2 wheeled);Straight cane Gait Pattern/deviations: Step-through pattern;Decreased stance time - right Gait velocity: decr   General Gait Details: Cues for posture, gait sequence, and to self-monitor for activity tolerance; good use of cane as well as RW   Stairs Stairs: Yes   Stair Management: One rail Left;Step to pattern;Forwards;With  cane Number of Stairs: 5 (x3) General stair comments: verbal and demo cues for sequence  Wheelchair Mobility    Modified Rankin (Stroke Patients Only)       Balance     Sitting balance-Leahy Scale: Good       Standing balance-Leahy Scale: Fair                      Cognition Arousal/Alertness: Awake/alert Behavior During Therapy: WFL for tasks assessed/performed Overall Cognitive Status: Within Functional Limits for tasks assessed                      Exercises Total Joint Exercises Ankle Circles/Pumps: AROM;Both;20 reps Quad Sets: AROM;Right;10 reps Gluteal Sets: AROM;Both;10 reps Towel Squeeze: AROM;Both;10 reps Heel Slides: AROM;Right;10 reps Hip ABduction/ADduction: AROM;Right;10 reps Bridges: AROM;Both;10 reps    General Comments        Pertinent Vitals/Pain Pain Assessment: 0-10 Pain Score: 3  Pain Location: R hip Pain Descriptors / Indicators: Sore Pain Intervention(s): Monitored during session    Home Living                      Prior Function            PT Goals (current goals can now be found in the care plan section) Acute Rehab PT Goals Patient Stated Goal: Be able to stand up and walk without pain, workout, return to [facility] PT Goal Formulation: With patient Time For Goal Achievement: 08/05/16 Potential to Achieve Goals: Good Progress towards PT goals: Progressing toward goals    Frequency    7X/week      PT Plan Current plan remains appropriate    Co-evaluation  End of Session   Activity Tolerance: Patient tolerated treatment well Patient left: in chair;with call bell/phone within reach     Time: 1348-1430 PT Time Calculation (min) (ACUTE ONLY): 42 min  Charges:  $Gait Training: 23-37 mins $Therapeutic Exercise: 8-22 mins                    G Codes:      Helen Rogers Helen Rogers, 4:33 PM  Helen Rogers, Helen Rogers Pager [phone] Office  [phone]

## 2017-07-24 IMAGING — RF DG HIP (WITH PELVIS) OPERATIVE*R*
1 series · 2 of 2 positions shown · non-contrast
Comparison: None

FLUOROSCOPY TIME:  14 seconds

CLINICAL DATA: Right hip replacement

EXAM:
DG C-ARM 61-120 MIN; OPERATIVE RIGHT HIP WITH PELVIS

[Series 1: run · 2 of 2 slices shown]
[im 1/2]
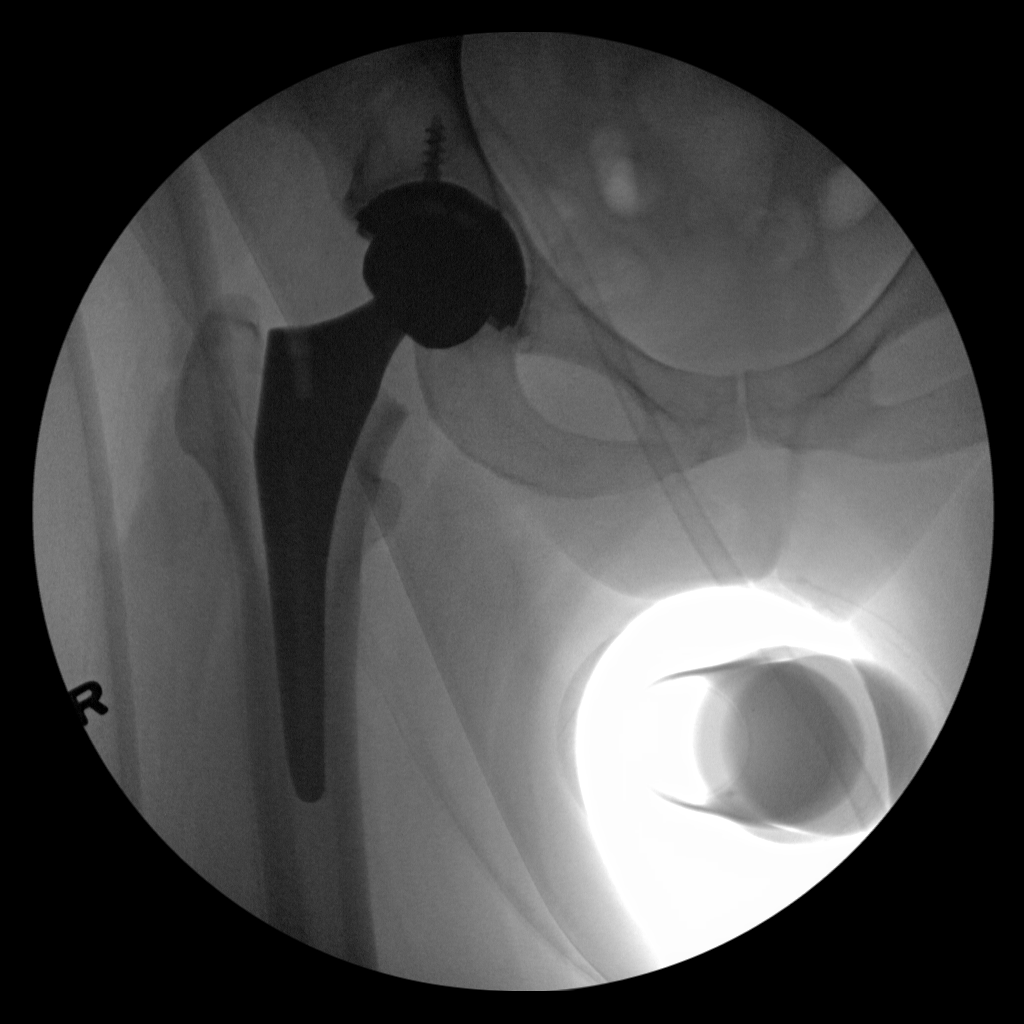
[im 2/2]
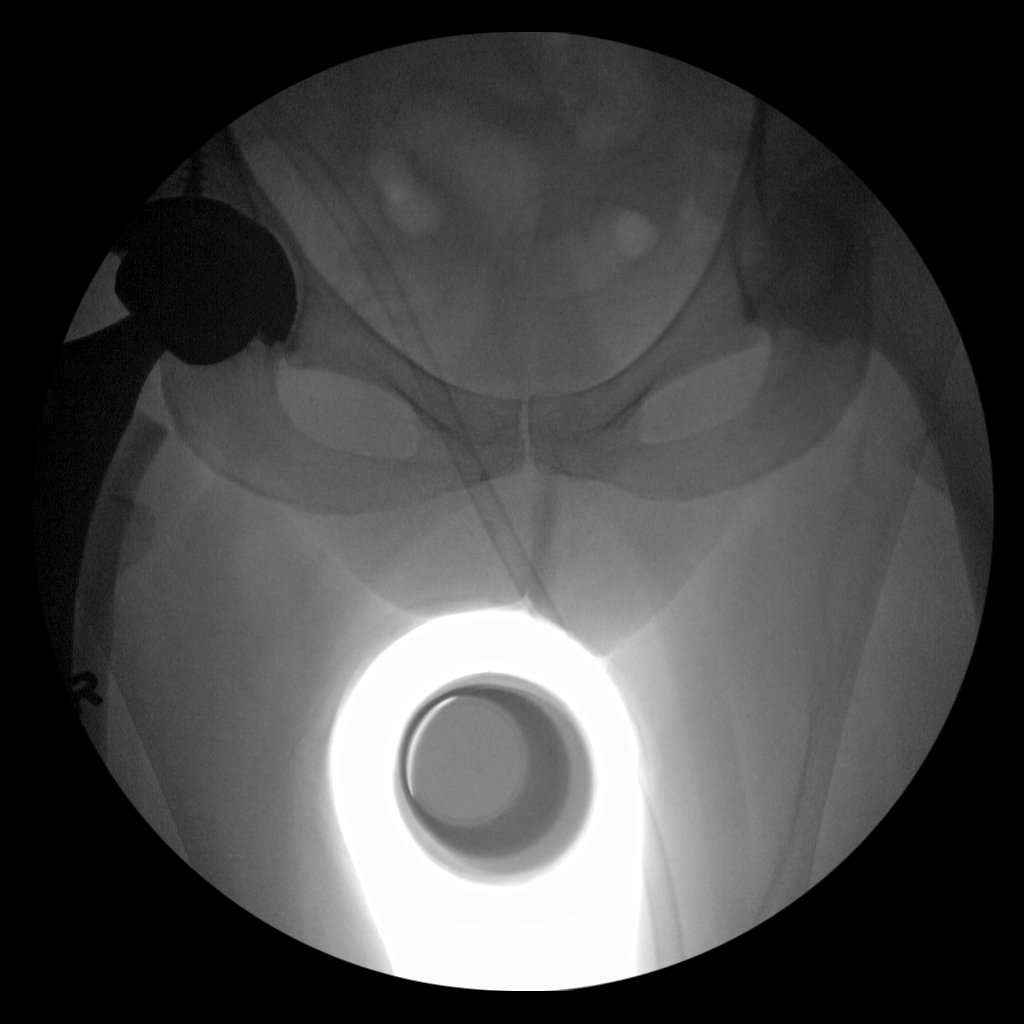

[2 of 2 positions shown; findings below may reference images not displayed]

FINDINGS: Right total hip arthroplasty without failure or complication. No
fracture or dislocation.
IMPRESSION: Right total hip arthroplasty without failure or complication.

## 2019-06-06 ENCOUNTER — Other Ambulatory Visit: Payer: Self-pay | Admitting: *Deleted

## 2019-06-06 DIAGNOSIS — Z20822 Contact with and (suspected) exposure to covid-19: Secondary | ICD-10-CM

## 2019-06-09 LAB — NOVEL CORONAVIRUS, NAA: SARS-CoV-2, NAA: NOT DETECTED

## 2020-05-30 ENCOUNTER — Other Ambulatory Visit: Payer: Managed Care, Other (non HMO)

## 2020-05-30 DIAGNOSIS — Z20822 Contact with and (suspected) exposure to covid-19: Secondary | ICD-10-CM

## 2020-05-31 LAB — SARS-COV-2, NAA 2 DAY TAT

## 2020-05-31 LAB — NOVEL CORONAVIRUS, NAA: SARS-CoV-2, NAA: NOT DETECTED

## 2022-06-04 ENCOUNTER — Other Ambulatory Visit: Payer: Self-pay | Admitting: Urology

## 2022-06-04 DIAGNOSIS — N281 Cyst of kidney, acquired: Secondary | ICD-10-CM

## 2022-07-03 ENCOUNTER — Ambulatory Visit
Admission: RE | Admit: 2022-07-03 | Discharge: 2022-07-03 | Disposition: A | Discharge status: Home / Self Care | Payer: Self-pay | Source: Ambulatory Visit | Attending: Urology | Admitting: Urology

## 2022-07-03 DIAGNOSIS — N281 Cyst of kidney, acquired: Secondary | ICD-10-CM

## 2022-07-03 MED ORDER — GADOPICLENOL 0.5 MMOL/ML IV SOLN
10.0000 mL | Freq: Once | INTRAVENOUS | Status: AC | PRN
  Administered 2022-07-03: 10 mL via INTRAVENOUS

## 2022-07-16 ENCOUNTER — Other Ambulatory Visit: Payer: Self-pay | Admitting: Urology

## 2022-09-01 NOTE — Progress Notes (Signed)
COVID Vaccine Completed:  Date of COVID positive in last 90 days:  PCP - Helane Rima, MD Cardiologist -   Chest x-ray -  EKG -  Stress Test -  ECHO -  Cardiac Cath -  Pacemaker/ICD device last checked: Spinal Cord Stimulator:  Bowel Prep -   Sleep Study -  CPAP -   Fasting Blood Sugar -  Checks Blood Sugar _____ times a day  Last dose of GLP1 agonist-  N/A GLP1 instructions:  N/A   Last dose of SGLT-2 inhibitors-  N/A SGLT-2 instructions: N/A   Blood Thinner Instructions: Aspirin Instructions: Last Dose:  Activity level:  Can go up a flight of stairs and perform activities of daily living without stopping and without symptoms of chest pain or shortness of breath.  Able to exercise without symptoms  Unable to go up a flight of stairs without symptoms of     Anesthesia review:   Patient denies shortness of breath, fever, cough and chest pain at PAT appointment  Patient verbalized understanding of instructions that were given to them at the PAT appointment. Patient was also instructed that they will need to review over the PAT instructions again at home before surgery.

## 2022-09-01 NOTE — Patient Instructions (Signed)
SURGICAL WAITING ROOM VISITATION  Patients having surgery or a procedure may have no more than 2 support people in the waiting area - these visitors may rotate.    Children under the age of 57 must have an adult with them who is not the patient.  Due to an increase in RSV and influenza rates and associated hospitalizations, children ages 21 and under may not visit patients in [place].  If the patient needs to stay at the hospital during part of their recovery, the visitor guidelines for inpatient rooms apply. Pre-op nurse will coordinate an appropriate time for 1 support person to accompany patient in pre-op.  This support person may not rotate.    Please refer to the [facility] website for the visitor guidelines for Inpatients (after your surgery is over and you are in a regular room).    Your procedure is scheduled on: 09/12/22   Report to [facility] Main Entrance    Report to admitting at 10:15 AM   Call this number if you have problems the morning of surgery [phone]   Follow a clear liquid diet the day before surgery   Nothing to drink after midnight night before surgery          If you have questions, please contact your surgeon's office.   FOLLOW BOWEL PREP AND ANY ADDITIONAL PRE OP INSTRUCTIONS YOU RECEIVED FROM YOUR SURGEON'S OFFICE!!!     Oral Hygiene is also important to reduce your risk of infection.                                    Remember - BRUSH YOUR TEETH THE MORNING OF SURGERY WITH YOUR REGULAR TOOTHPASTE  DENTURES WILL BE REMOVED PRIOR TO SURGERY PLEASE DO NOT APPLY "Poly grip" OR ADHESIVES!!!   Take these medicines the morning of surgery with A SIP OF WATER: Rosuvastatin                               You may not have any metal on your body including hair pins, jewelry, and body piercing             Do not wear make-up, lotions, powders, perfumes, or deodorant  Do not wear nail polish including gel and S&S, artificial/acrylic  nails, or any other type of covering on natural nails including finger and toenails. If you have artificial nails, gel coating, etc. that needs to be removed by a nail salon please have this removed prior to surgery or surgery may need to be canceled/ delayed if the surgeon/ anesthesia feels like they are unable to be safely monitored.   Do not shave  48 hours prior to surgery.    Do not bring valuables to the hospital. [place].   Contacts, glasses, dentures or bridgework may not be worn into surgery.   Bring small overnight bag day of surgery.   DO NOT [place]. PHARMACY WILL DISPENSE MEDICATIONS LISTED ON YOUR MEDICATION LIST TO YOU DURING YOUR ADMISSION [place]!              Please read over the following fact sheets you were given: IF YOU HAVE QUESTIONS ABOUT YOUR PRE-OP INSTRUCTIONS PLEASE CALL  [phone] Schneiders    If you received a COVID test during your pre-op visit  it is requested that you wear a mask when out in public, stay away from anyone that may not be feeling well and notify your surgeon if you develop symptoms. If you test positive for Covid or have been in contact with anyone that has tested positive in the last 10 days please notify you surgeon.    [place] - Preparing for Surgery Before surgery, you can play an important role.  Because skin is not sterile, your skin needs to be as free of germs as possible.  You can reduce the number of germs on your skin by washing with CHG (chlorahexidine gluconate) soap before surgery.  CHG is an antiseptic cleaner which kills germs and bonds with the skin to continue killing germs even after washing. Please DO NOT use if you have an allergy to CHG or antibacterial soaps.  If your skin becomes reddened/irritated stop using the CHG and inform your nurse when you arrive at Short Stay. Do not shave (including legs and underarms) for at least  48 hours prior to the first CHG shower.  You may shave your face/neck.  Please follow these instructions carefully:  1.  Shower with CHG Soap the night before surgery and the  morning of surgery.  2.  If you choose to wash your hair, wash your hair first as usual with your normal  shampoo.  3.  After you shampoo, rinse your hair and body thoroughly to remove the shampoo.                             4.  Use CHG as you would any other liquid soap.  You can apply chg directly to the skin and wash.  Gently with a scrungie or clean washcloth.  5.  Apply the CHG Soap to your body ONLY FROM THE NECK DOWN.   Do   not use on face/ open                           Wound or open sores. Avoid contact with eyes, ears mouth and   genitals (private parts).                       Wash face,  Genitals (private parts) with your normal soap.             6.  Wash thoroughly, paying special attention to the area where your    surgery  will be performed.  7.  Thoroughly rinse your body with warm water from the neck down.  8.  DO NOT shower/wash with your normal soap after using and rinsing off the CHG Soap.                9.  Pat yourself dry with a clean towel.            10.  Wear clean pajamas.            11.  Place clean sheets on your bed the night of your first shower and do not  sleep with pets. Day of Surgery : Do not apply any lotions/deodorants the morning of surgery.  Please wear clean clothes to the hospital/surgery center.  FAILURE TO FOLLOW THESE INSTRUCTIONS MAY RESULT IN THE CANCELLATION OF YOUR SURGERY  PATIENT SIGNATURE_________________________________  NURSE  SIGNATURE__________________________________  ________________________________________________________________________ WHAT IS A BLOOD TRANSFUSION? Blood Transfusion Information  A transfusion is the replacement of blood or some of its parts. Blood is made up of multiple cells which provide different functions. Red blood cells carry oxygen  and are used for blood loss replacement. White blood cells fight against infection. Platelets control bleeding. Plasma helps clot blood. Other blood products are available for specialized needs, such as hemophilia or other clotting disorders. BEFORE THE TRANSFUSION  Who gives blood for transfusions?  Healthy volunteers who are fully evaluated to make sure their blood is safe. This is blood bank blood. Transfusion therapy is the safest it has ever been in the practice of medicine. Before blood is taken from a donor, a complete history is taken to make sure that person has no history of diseases nor engages in risky social behavior (examples are intravenous drug use or sexual activity with multiple partners). The donor's travel history is screened to minimize risk of transmitting infections, such as malaria. The donated blood is tested for signs of infectious diseases, such as HIV and hepatitis. The blood is then tested to be sure it is compatible with you in order to minimize the chance of a transfusion reaction. If you or a relative donates blood, this is often done in anticipation of surgery and is not appropriate for emergency situations. It takes many days to process the donated blood. RISKS AND COMPLICATIONS Although transfusion therapy is very safe and saves many lives, the main dangers of transfusion include:  Getting an infectious disease. Developing a transfusion reaction. This is an allergic reaction to something in the blood you were given. Every precaution is taken to prevent this. The decision to have a blood transfusion has been considered carefully by your caregiver before blood is given. Blood is not given unless the benefits outweigh the risks. AFTER THE TRANSFUSION Right after receiving a blood transfusion, you will usually feel much better and more energetic. This is especially true if your red blood cells have gotten low (anemic). The transfusion raises the level of the red blood  cells which carry oxygen, and this usually causes an energy increase. The nurse administering the transfusion will monitor you carefully for complications. HOME CARE INSTRUCTIONS  No special instructions are needed after a transfusion. You may find your energy is better. Speak with your caregiver about any limitations on activity for underlying diseases you may have. SEEK MEDICAL CARE IF:  Your condition is not improving after your transfusion. You develop redness or irritation at the intravenous (IV) site. SEEK IMMEDIATE MEDICAL CARE IF:  Any of the following symptoms occur over the next 12 hours: Shaking chills. You have a temperature by mouth above 102 F (38.9 C), not controlled by medicine. Chest, back, or muscle pain. People around you feel you are not acting correctly or are confused. Shortness of breath or difficulty breathing. Dizziness and fainting. You get a rash or develop hives. You have a decrease in urine output. Your urine turns a dark color or changes to pink, red, or brown. Any of the following symptoms occur over the next 10 days: You have a temperature by mouth above 102 F (38.9 C), not controlled by medicine. Shortness of breath. Weakness after normal activity. The white part of the eye turns yellow (jaundice). You have a decrease in the amount of urine or are urinating less often. Your urine turns a dark color or changes to pink, red, or brown. Document Released: 07/11/2000 Document Revised: 10/06/2011 Document  Reviewed: 02/28/2008 Exit[employer] Patient Information 2014 [place], [place].  _______________________________________________________________________

## 2022-09-02 ENCOUNTER — Encounter (HOSPITAL_COMMUNITY): Payer: Self-pay

## 2022-09-02 ENCOUNTER — Encounter (HOSPITAL_COMMUNITY)
Admission: RE | Admit: 2022-09-02 | Discharge: 2022-09-02 | Disposition: A | Discharge status: Home / Self Care | Payer: 59 | Source: Ambulatory Visit | Attending: Urology | Admitting: Urology

## 2022-09-02 VITALS — BP 130/89 | HR 93 | Temp 98.3°F | Resp 14 | Ht 69.0 in | Wt 195.0 lb

## 2022-09-02 DIAGNOSIS — Z01812 Encounter for preprocedural laboratory examination: Secondary | ICD-10-CM | POA: Insufficient documentation

## 2022-09-02 DIAGNOSIS — I10 Essential (primary) hypertension: Secondary | ICD-10-CM | POA: Insufficient documentation

## 2022-09-02 HISTORY — DX: Pneumonia, unspecified organism: J18.9

## 2022-09-02 LAB — BASIC METABOLIC PANEL
Anion gap: 8 (ref 5–15)
BUN: 22 mg/dL — ABNORMAL HIGH (ref 6–20)
CO2: 27 mmol/L (ref 22–32)
Calcium: 9.5 mg/dL (ref 8.9–10.3)
Chloride: 105 mmol/L (ref 98–111)
Creatinine, Ser: 1.45 mg/dL — ABNORMAL HIGH (ref 0.44–1.00)
GFR, Estimated: 42 mL/min — ABNORMAL LOW (ref >60)
Glucose, Bld: 110 mg/dL — ABNORMAL HIGH (ref 70–99)
Potassium: 3.7 mmol/L (ref 3.5–5.1)
Sodium: 140 mmol/L (ref 135–145)

## 2022-09-02 LAB — CBC
HCT: 43 % (ref 36.0–46.0)
Hemoglobin: 13.6 g/dL (ref 12.0–15.0)
MCH: 27 pg (ref 26.0–34.0)
MCHC: 31.6 g/dL (ref 30.0–36.0)
MCV: 85.3 fL (ref 80.0–100.0)
Platelets: 264 10*3/uL (ref 150–400)
RBC: 5.04 MIL/uL (ref 3.87–5.11)
RDW: 13.5 % (ref 11.5–15.5)
WBC: 8.1 10*3/uL (ref 4.0–10.5)
nRBC: 0 % (ref 0.0–0.2)

## 2022-09-11 NOTE — H&P (Signed)
Office Visit Report     09/02/2022   --------------------------------------------------------------------------------   Helen Rogers  MRN: [id number]  DOB: 20-Mar-1966, 57 year old Female  SSN:    PRIMARY CARE:     REFERRING:  Kariya Lavergne A. Lovena Neighbours, MD  PROVIDER:  Bjorn Loser, M.D.  TREATING:  [place], [place]  LOCATION:  [facility] [phone]     --------------------------------------------------------------------------------   CC/HPI: Pt presents today for pre-operative history and physical exam in anticipation of cysto, possible bladder bx, and robotic assisted lap left partial nephrectomy by Dr. Lovena Neighbours on 09/12/22. She is doing well and is without complaint.   Pt denies F/C, HA, CP, SOB, N/V, diarrhea/constipation, back pain, flank pain, hematuria, and dysuria.    HX:   Renal mass   Helen Rogers is a 1 female with a solid and enhancing renal mass measuring 1.8 cm involving the LEFT kidney. The mass was initially discovered on CTU during an evaluation for gross hematuria and further characterized via MRI.   -Anatomy: Mesophytic 1.8 cm left posterior upper pole mass. Right kidney is WNL. No stones x2  -Personal/family history of GU malignancies: Sister and mother with hx of RCC  -Smoking history: Non-smoker  -Prior abdominal surgeries: Lap hysterectomy  -Renal function: Hx of stage IIIa CKD  -History of kidney stones: denies  -Hematuria has since resolved     ALLERGIES: Oxycodone-Acetaminophen - Itching, Other Reaction    MEDICATIONS: Biotin  Multivitamin  Olmesartan Medoxomil 40 mg tablet 1 tablet PO Daily  Rosuvastatin Calcium 5 mg tablet 1 tablet PO Daily  Turmeric  Vitamin C 1,000 mg tablet  Vitamin D 125Mcg  Zinc 50 mg tablet     GU PSH: Hysterectomy - about 2011 Locm 300-399Mg/Ml Iodine,1Ml - 06/02/2022       PSH Notes: 07/29/2016  Total hip arthroplasty (Right)  Date Unknown  Abdominal hysterectomy  Date Unknown   Colonoscopy  Date Unknown  Hand surgery (Left)     NON-GU PSH: Hip Replacement - about 2018     GU PMH: Left renal neoplasm - 08/29/2022, - 07/11/2022 Chronic kidney disease stage 3 (GFR 30-60) - 07/11/2022 Gross hematuria (Stable) - 07/11/2022, - 06/02/2022, - 05/14/2022      PMH Notes: CKD III Followed by Dr. Jeanella Anton  Proteinuria   NON-GU PMH: Hypercholesterolemia Hypertension    FAMILY HISTORY: 1 son - Son 3 daughters - Daughter Blood in the urine - Sister COPD bronchitis - Mother Deceased - Father Heart Attack - Father Hypertension - Mother, Brother Kidney Cancer - Sister, Mother Kidney Failure - Brother Prostate Cancer - Brother   SOCIAL HISTORY: Marital Status: Married Preferred Language: English; Ethnicity: Not Hispanic Or Latino; Race: White Current Smoking Status: Patient has never smoked.   Tobacco Use Assessment Completed: Used Tobacco in last 30 days? Does not use smokeless tobacco. Does drink.  Does not use drugs. Drinks 1 caffeinated drink per day. Has not had a blood transfusion. Patient's occupation Lawyer.     Notes: ETOH rare    REVIEW OF SYSTEMS:    GU Review Female:   Patient denies frequent urination, hard to postpone urination, burning /pain with urination, get up at night to urinate, leakage of urine, stream starts and stops, trouble starting your stream, have to strain to urinate, and being pregnant.  Gastrointestinal (Upper):   Patient denies nausea, vomiting, and indigestion/ heartburn.  Gastrointestinal (Lower):   Patient denies diarrhea and constipation.  Constitutional:   Patient denies fever, night sweats, weight  loss, and fatigue.  Skin:   Patient denies skin rash/ lesion and itching.  Eyes:   Patient denies blurred vision and double vision.  Ears/ Nose/ Throat:   Patient denies sore throat and sinus problems.  Hematologic/Lymphatic:   Patient denies swollen glands and easy bruising.  Cardiovascular:   Patient  denies leg swelling and chest pains.  Respiratory:   Patient denies cough and shortness of breath.  Endocrine:   Patient denies excessive thirst.  Musculoskeletal:   Patient denies back pain and joint pain.  Neurological:   Patient denies headaches and dizziness.  Psychologic:   Patient denies depression and anxiety.   VITAL SIGNS:      09/02/2022 03:41 PM  Weight 198 lb / 89.81 kg  Height 69 in / 175.26 cm  BP 134/84 mmHg  Pulse 80 /min  Temperature 97.1 F / 36.1 C  BMI 29.2 kg/m   MULTI-SYSTEM PHYSICAL EXAMINATION:    Constitutional: Well-nourished. No physical deformities. Normally developed. Good grooming.  Neck: Neck symmetrical, not swollen. Normal tracheal position.  Respiratory: Normal breath sounds. No labored breathing, no use of accessory muscles.   Cardiovascular: Regular rate and rhythm. No murmur, no gallop.  Lymphatic: No enlargement of neck, axillae, groin.  Skin: No paleness, no jaundice, no cyanosis. No lesion, no ulcer, no rash.  Neurologic / Psychiatric: Oriented to time, oriented to place, oriented to person. No depression, no anxiety, no agitation.  Gastrointestinal: No mass, no tenderness, no rigidity, non obese abdomen.  Eyes: Normal conjunctivae. Normal eyelids.  Ears, Nose, Mouth, and Throat: Left ear no scars, no lesions, no masses. Right ear no scars, no lesions, no masses. Nose no scars, no lesions, no masses. Normal hearing. Normal lips.  Musculoskeletal: Normal gait and station of head and neck.     Complexity of Data:  Records Review:   Previous Patient Records  Urine Test Review:   Urinalysis   09/02/22  Urinalysis  Urine Appearance Clear   Urine Color Yellow   Urine Glucose Neg mg/dL  Urine Bilirubin Neg mg/dL  Urine Ketones Neg mg/dL  Urine Specific Gravity 1.025   Urine Blood Neg ery/uL  Urine pH <=5.0   Urine Protein 2+ mg/dL  Urine Urobilinogen 0.2 mg/dL  Urine Nitrites Neg   Urine Leukocyte Esterase Neg leu/uL  Urine WBC/hpf NS  (Not Seen)   Urine RBC/hpf NS (Not Seen)   Urine Epithelial Cells 0 - 5/hpf   Urine Bacteria Rare (0-9/hpf)   Urine Mucous Not Present   Urine Yeast NS (Not Seen)   Urine Trichomonas Not Present   Urine Cystals NS (Not Seen)   Urine Casts NS (Not Seen)   Urine Sperm Not Present    PROCEDURES:          Urinalysis w/Scope - 81001 Dipstick Dipstick Cont'd Micro  Color: Yellow Bilirubin: Neg mg/dL WBC/hpf: NS (Not Seen)  Appearance: Clear Ketones: Neg mg/dL RBC/hpf: NS (Not Seen)  Specific Gravity: 1.025 Blood: Neg ery/uL Bacteria: Rare (0-9/hpf)  pH: <=5.0 Protein: 2+ mg/dL Cystals: NS (Not Seen)  Glucose: Neg mg/dL Urobilinogen: 0.2 mg/dL Casts: NS (Not Seen)    Nitrites: Neg Trichomonas: Not Present    Leukocyte Esterase: Neg leu/uL Mucous: Not Present      Epithelial Cells: 0 - 5/hpf      Yeast: NS (Not Seen)      Sperm: Not Present    ASSESSMENT:      ICD-10 Details  1 GU:   Gross hematuria - R31.0  2   Left renal neoplasm - D49.512    PLAN:           Orders Labs Urine Culture          Schedule Return Visit/Planned Activity: Keep Scheduled Appointment - Schedule Surgery          Document Letter(s):  Created for Patient: Clinical Summary         Notes:   There are no changes in the patients history or physical exam since last evaluation by Dr. Lovena Neighbours. Pt is scheduled to undergo cysto, possible bladder bx, and RAL left partial nephrectomy on 09/12/22.   Urine for culture to r/o infection   All pt's questions were answered to the best of my ability.    -I personally reviewed imaging results and films with the patient. We discussed that the mass in question has features concerning for malignancy. I explained the natural history of presumed renal cell carcinoma. I reviewed the AUA guidelines for evaluation and treatment of the small renal mass. The options of active surveillance, in situ tumor ablation, partial and radical nephrectomy was discussed. The risks of  robot-assisted LEFT partial nephrectomy were discussed in detail including but not limited to: negative pathology, open conversion, completion nephrectomy, infection of the urinary tract/skin/abdominal cavity, VTE, MI/CVA, lymphatic leak, injury to adjacent solid/hollow viscus organs, bleeding requiring a blood transfusion, catastrophic bleeding, hernia formation, need for postoperative angioembolization, urinary leak requiring stent/drain, and other imponderables.   I will perform cystoscopy at the time of her surgery to complete her hematuria evaluation.

## 2022-09-12 ENCOUNTER — Encounter (HOSPITAL_COMMUNITY): Payer: Self-pay | Admitting: Urology

## 2022-09-12 ENCOUNTER — Observation Stay (HOSPITAL_COMMUNITY)
Admission: RE | Admit: 2022-09-12 | Discharge: 2022-09-14 | Disposition: A | Discharge status: Home / Self Care | Payer: 59 | Source: Ambulatory Visit | Attending: Urology | Admitting: Urology

## 2022-09-12 ENCOUNTER — Other Ambulatory Visit: Payer: Self-pay

## 2022-09-12 ENCOUNTER — Ambulatory Visit (HOSPITAL_BASED_OUTPATIENT_CLINIC_OR_DEPARTMENT_OTHER): Payer: 59 | Admitting: Anesthesiology

## 2022-09-12 ENCOUNTER — Encounter (HOSPITAL_COMMUNITY)
Admission: RE | Disposition: A | Discharge status: Home / Self Care | Payer: Self-pay | Source: Ambulatory Visit | Attending: Urology

## 2022-09-12 ENCOUNTER — Ambulatory Visit (HOSPITAL_COMMUNITY): Payer: 59 | Admitting: Anesthesiology

## 2022-09-12 DIAGNOSIS — D49512 Neoplasm of unspecified behavior of left kidney: Secondary | ICD-10-CM | POA: Insufficient documentation

## 2022-09-12 DIAGNOSIS — N2889 Other specified disorders of kidney and ureter: Secondary | ICD-10-CM

## 2022-09-12 DIAGNOSIS — R310 Gross hematuria: Secondary | ICD-10-CM | POA: Insufficient documentation

## 2022-09-12 DIAGNOSIS — N183 Chronic kidney disease, stage 3 unspecified: Secondary | ICD-10-CM | POA: Diagnosis not present

## 2022-09-12 DIAGNOSIS — I129 Hypertensive chronic kidney disease with stage 1 through stage 4 chronic kidney disease, or unspecified chronic kidney disease: Secondary | ICD-10-CM | POA: Diagnosis not present

## 2022-09-12 HISTORY — PX: ROBOTIC ASSITED PARTIAL NEPHRECTOMY: SHX6087

## 2022-09-12 HISTORY — PX: CYSTOSCOPY WITH BIOPSY: SHX5122

## 2022-09-12 LAB — TYPE AND SCREEN
ABO/RH(D): O POS
Antibody Screen: NEGATIVE

## 2022-09-12 SURGERY — NEPHRECTOMY, PARTIAL, ROBOT-ASSISTED
Anesthesia: General

## 2022-09-12 MED ORDER — HYDROMORPHONE HCL 1 MG/ML IJ SOLN
0.2500 mg | INTRAMUSCULAR | Status: DC | PRN
  Administered 2022-09-12: 0.25 mg via INTRAVENOUS

## 2022-09-12 MED ORDER — TRAMADOL HCL 50 MG PO TABS
50.0000 mg | ORAL_TABLET | Freq: Four times a day (QID) | ORAL | Status: DC | PRN
  Administered 2022-09-12 – 2022-09-13 (×3): 100 mg via ORAL
  Filled 2022-09-12 (×3): qty 2

## 2022-09-12 MED ORDER — MIDAZOLAM HCL 2 MG/2ML IJ SOLN
INTRAMUSCULAR | Status: AC
  Filled 2022-09-12: qty 2

## 2022-09-12 MED ORDER — TRAMADOL HCL 50 MG PO TABS: 50.0000 mg | ORAL_TABLET | Freq: Four times a day (QID) | ORAL | 0 refills | Status: AC | PRN

## 2022-09-12 MED ORDER — FENTANYL CITRATE (PF) 100 MCG/2ML IJ SOLN
INTRAMUSCULAR | Status: AC
  Filled 2022-09-12: qty 2

## 2022-09-12 MED ORDER — ONDANSETRON HCL 4 MG/2ML IJ SOLN: 4.0000 mg | Freq: Once | INTRAMUSCULAR | Status: DC | PRN

## 2022-09-12 MED ORDER — LACTATED RINGERS IV SOLN: INTRAVENOUS | Status: DC

## 2022-09-12 MED ORDER — ONDANSETRON HCL 4 MG/2ML IJ SOLN
INTRAMUSCULAR | Status: DC | PRN
  Administered 2022-09-12: 4 mg via INTRAVENOUS

## 2022-09-12 MED ORDER — PROPOFOL 10 MG/ML IV BOLUS
INTRAVENOUS | Status: DC | PRN
  Administered 2022-09-12: 200 mg via INTRAVENOUS

## 2022-09-12 MED ORDER — PHENYLEPHRINE HCL (PRESSORS) 10 MG/ML IV SOLN
INTRAVENOUS | Status: AC
  Filled 2022-09-12: qty 1

## 2022-09-12 MED ORDER — PROMETHAZINE HCL 12.5 MG PO TABS: 12.5000 mg | ORAL_TABLET | ORAL | 0 refills | Status: AC | PRN

## 2022-09-12 MED ORDER — CHLORHEXIDINE GLUCONATE 0.12 % MT SOLN
15.0000 mL | Freq: Once | OROMUCOSAL | Status: AC
  Administered 2022-09-12: 15 mL via OROMUCOSAL

## 2022-09-12 MED ORDER — FENTANYL CITRATE (PF) 100 MCG/2ML IJ SOLN
INTRAMUSCULAR | Status: DC | PRN
  Administered 2022-09-12 (×2): 50 ug via INTRAVENOUS

## 2022-09-12 MED ORDER — LIDOCAINE HCL (PF) 2 % IJ SOLN
INTRAMUSCULAR | Status: AC
  Filled 2022-09-12: qty 5

## 2022-09-12 MED ORDER — CEFAZOLIN SODIUM-DEXTROSE 2-4 GM/100ML-% IV SOLN
2.0000 g | INTRAVENOUS | Status: AC
  Administered 2022-09-12: 2 g via INTRAVENOUS
  Filled 2022-09-12: qty 100

## 2022-09-12 MED ORDER — BUPIVACAINE LIPOSOME 1.3 % IJ SUSP
INTRAMUSCULAR | Status: AC
  Filled 2022-09-12: qty 20

## 2022-09-12 MED ORDER — SUGAMMADEX SODIUM 200 MG/2ML IV SOLN
INTRAVENOUS | Status: DC | PRN
  Administered 2022-09-12: 200 mg via INTRAVENOUS

## 2022-09-12 MED ORDER — ACETAMINOPHEN 10 MG/ML IV SOLN
1000.0000 mg | Freq: Once | INTRAVENOUS | Status: DC | PRN
  Administered 2022-09-12: 1000 mg via INTRAVENOUS

## 2022-09-12 MED ORDER — "VISTASEAL 4 ML SINGLE DOSE KIT "
4.0000 mL | PACK | Freq: Once | CUTANEOUS | Status: AC
  Administered 2022-09-12: 4 mL via TOPICAL
  Filled 2022-09-12: qty 4

## 2022-09-12 MED ORDER — PHENYLEPHRINE HCL (PRESSORS) 10 MG/ML IV SOLN
INTRAVENOUS | Status: DC | PRN
  Administered 2022-09-12: 80 ug via INTRAVENOUS

## 2022-09-12 MED ORDER — ACETAMINOPHEN 500 MG PO TABS
1000.0000 mg | ORAL_TABLET | Freq: Four times a day (QID) | ORAL | Status: AC
  Administered 2022-09-12 – 2022-09-13 (×3): 1000 mg via ORAL
  Filled 2022-09-12 (×4): qty 2

## 2022-09-12 MED ORDER — PROPOFOL 10 MG/ML IV BOLUS
INTRAVENOUS | Status: AC
  Filled 2022-09-12: qty 20

## 2022-09-12 MED ORDER — ACETAMINOPHEN 10 MG/ML IV SOLN
INTRAVENOUS | Status: AC
  Filled 2022-09-12: qty 100

## 2022-09-12 MED ORDER — DOCUSATE SODIUM 100 MG PO CAPS: 100.0000 mg | ORAL_CAPSULE | Freq: Two times a day (BID) | ORAL | Status: AC

## 2022-09-12 MED ORDER — HYOSCYAMINE SULFATE 0.125 MG SL SUBL
0.1250 mg | SUBLINGUAL_TABLET | SUBLINGUAL | Status: DC | PRN
  Administered 2022-09-12: 0.125 mg via SUBLINGUAL
  Filled 2022-09-12: qty 1

## 2022-09-12 MED ORDER — INFLUENZA VAC SPLIT QUAD 0.5 ML IM SUSY: 0.5000 mL | PREFILLED_SYRINGE | INTRAMUSCULAR | Status: DC

## 2022-09-12 MED ORDER — ROCURONIUM BROMIDE 10 MG/ML (PF) SYRINGE
PREFILLED_SYRINGE | INTRAVENOUS | Status: AC
  Filled 2022-09-12: qty 10

## 2022-09-12 MED ORDER — DIPHENHYDRAMINE HCL 50 MG/ML IJ SOLN: 12.5000 mg | Freq: Four times a day (QID) | INTRAMUSCULAR | Status: DC | PRN

## 2022-09-12 MED ORDER — LACTATED RINGERS IR SOLN
Status: DC | PRN
  Administered 2022-09-12: 1000 mL

## 2022-09-12 MED ORDER — LIDOCAINE HCL (PF) 2 % IJ SOLN
INTRAMUSCULAR | Status: DC | PRN
  Administered 2022-09-12: 60 mg via INTRADERMAL

## 2022-09-12 MED ORDER — DOCUSATE SODIUM 100 MG PO CAPS
100.0000 mg | ORAL_CAPSULE | Freq: Two times a day (BID) | ORAL | Status: DC
  Administered 2022-09-12 – 2022-09-14 (×4): 100 mg via ORAL
  Filled 2022-09-12 (×4): qty 1

## 2022-09-12 MED ORDER — ONDANSETRON HCL 4 MG/2ML IJ SOLN
INTRAMUSCULAR | Status: AC
  Filled 2022-09-12: qty 2

## 2022-09-12 MED ORDER — ORAL CARE MOUTH RINSE: 15.0000 mL | Freq: Once | OROMUCOSAL | Status: AC

## 2022-09-12 MED ORDER — MIDAZOLAM HCL 5 MG/5ML IJ SOLN
INTRAMUSCULAR | Status: DC | PRN
  Administered 2022-09-12: 2 mg via INTRAVENOUS

## 2022-09-12 MED ORDER — ROSUVASTATIN CALCIUM 5 MG PO TABS
5.0000 mg | ORAL_TABLET | Freq: Every day | ORAL | Status: DC
  Administered 2022-09-12 – 2022-09-13 (×2): 5 mg via ORAL
  Filled 2022-09-12 (×2): qty 1

## 2022-09-12 MED ORDER — ROCURONIUM BROMIDE 100 MG/10ML IV SOLN
INTRAVENOUS | Status: DC | PRN
  Administered 2022-09-12: 80 mg via INTRAVENOUS

## 2022-09-12 MED ORDER — ONDANSETRON HCL 4 MG/2ML IJ SOLN: 4.0000 mg | INTRAMUSCULAR | Status: DC | PRN

## 2022-09-12 MED ORDER — HYDROMORPHONE HCL 1 MG/ML IJ SOLN
INTRAMUSCULAR | Status: AC
  Filled 2022-09-12: qty 1

## 2022-09-12 MED ORDER — SODIUM CHLORIDE 0.45 % IV SOLN: INTRAVENOUS | Status: DC

## 2022-09-12 MED ORDER — PHENYLEPHRINE HCL-NACL 20-0.9 MG/250ML-% IV SOLN
INTRAVENOUS | Status: DC | PRN
  Administered 2022-09-12: 40 ug/min via INTRAVENOUS

## 2022-09-12 MED ORDER — SODIUM CHLORIDE (PF) 0.9 % IJ SOLN
INTRAMUSCULAR | Status: AC
  Filled 2022-09-12: qty 20

## 2022-09-12 MED ORDER — SODIUM CHLORIDE (PF) 0.9 % IJ SOLN
INTRAMUSCULAR | Status: DC | PRN
  Administered 2022-09-12: 20 mL via INTRAVENOUS

## 2022-09-12 MED ORDER — IRBESARTAN 300 MG PO TABS
300.0000 mg | ORAL_TABLET | Freq: Every day | ORAL | Status: DC
  Administered 2022-09-12 – 2022-09-13 (×2): 300 mg via ORAL
  Filled 2022-09-12 (×2): qty 1

## 2022-09-12 MED ORDER — DEXAMETHASONE SODIUM PHOSPHATE 10 MG/ML IJ SOLN
INTRAMUSCULAR | Status: AC
  Filled 2022-09-12: qty 1

## 2022-09-12 MED ORDER — DIPHENHYDRAMINE HCL 12.5 MG/5ML PO ELIX: 12.5000 mg | ORAL_SOLUTION | Freq: Four times a day (QID) | ORAL | Status: DC | PRN

## 2022-09-12 MED ORDER — DEXAMETHASONE SODIUM PHOSPHATE 10 MG/ML IJ SOLN
INTRAMUSCULAR | Status: DC | PRN
  Administered 2022-09-12: 10 mg via INTRAVENOUS

## 2022-09-12 MED ORDER — HYDROMORPHONE HCL 1 MG/ML IJ SOLN: 0.5000 mg | INTRAMUSCULAR | Status: DC | PRN

## 2022-09-12 MED ORDER — KETAMINE HCL 10 MG/ML IJ SOLN
INTRAMUSCULAR | Status: DC | PRN
  Administered 2022-09-12: 30 mg via INTRAVENOUS
  Administered 2022-09-12: 20 mg via INTRAVENOUS

## 2022-09-12 MED ORDER — BUPIVACAINE LIPOSOME 1.3 % IJ SUSP
INTRAMUSCULAR | Status: DC | PRN
  Administered 2022-09-12: 20 mL

## 2022-09-12 SURGICAL SUPPLY — 88 items
ADH SKN CLS APL DERMABOND .7 (GAUZE/BANDAGES/DRESSINGS) ×2
AGENT HMST KT MTR STRL THRMB (HEMOSTASIS)
APL ESCP 34 STRL LF DISP (HEMOSTASIS)
APL LAPSCP 35 DL APL RGD (MISCELLANEOUS) ×2
APL PRP STRL LF DISP 70% ISPRP (MISCELLANEOUS) ×2
APPLICATOR SURGIFLO ENDO (HEMOSTASIS) IMPLANT
APPLICATOR VISTASEAL 35 (MISCELLANEOUS) ×3 IMPLANT
BAG COUNTER SPONGE SURGICOUNT (BAG) IMPLANT
BAG DRN RND TRDRP ANRFLXCHMBR (UROLOGICAL SUPPLIES)
BAG SPNG CNTER NS LX DISP (BAG)
BAG URINE DRAIN 2000ML AR STRL (UROLOGICAL SUPPLIES) IMPLANT
BAG URO CATCHER STRL LF (MISCELLANEOUS) ×3 IMPLANT
CHLORAPREP W/TINT 26 (MISCELLANEOUS) ×3 IMPLANT
CLIP LIGATING HEM O LOK PURPLE (MISCELLANEOUS) ×6 IMPLANT
CLIP LIGATING HEMO LOK XL GOLD (MISCELLANEOUS) IMPLANT
CLIP LIGATING HEMO O LOK GREEN (MISCELLANEOUS) ×3 IMPLANT
CLIP SUT LAPRA TY ABSORB (SUTURE) ×6 IMPLANT
COVER SURGICAL LIGHT HANDLE (MISCELLANEOUS) ×3 IMPLANT
COVER TIP SHEARS 8 DVNC (MISCELLANEOUS) ×3 IMPLANT
COVER TIP SHEARS 8MM DA VINCI (MISCELLANEOUS) ×2
CUTTER ECHEON FLEX ENDO 45 340 (ENDOMECHANICALS) IMPLANT
DERMABOND ADVANCED .7 DNX12 (GAUZE/BANDAGES/DRESSINGS) ×3 IMPLANT
DRAIN CHANNEL 15F RND FF 3/16 (WOUND CARE) IMPLANT
DRAPE ARM DVNC X/XI (DISPOSABLE) ×12 IMPLANT
DRAPE COLUMN DVNC XI (DISPOSABLE) ×3 IMPLANT
DRAPE DA VINCI XI ARM (DISPOSABLE) ×8
DRAPE DA VINCI XI COLUMN (DISPOSABLE) ×2
DRAPE FOOT SWITCH (DRAPES) ×3 IMPLANT
DRAPE INCISE IOBAN 66X45 STRL (DRAPES) ×3 IMPLANT
DRAPE SHEET LG 3/4 BI-LAMINATE (DRAPES) ×3 IMPLANT
ELECT PENCIL ROCKER SW 15FT (MISCELLANEOUS) ×3 IMPLANT
ELECT REM PT RETURN 15FT ADLT (MISCELLANEOUS) ×3 IMPLANT
EVACUATOR SILICONE 100CC (DRAIN) IMPLANT
GAUZE 4X4 16PLY ~~LOC~~+RFID DBL (SPONGE) IMPLANT
GLOVE BIO SURGEON STRL SZ 6.5 (GLOVE) ×3 IMPLANT
GLOVE BIOGEL PI IND STRL 8 (GLOVE) ×3 IMPLANT
GLOVE SURG LX STRL 7.5 STRW (GLOVE) ×6 IMPLANT
GOWN SRG XL LVL 4 BRTHBL STRL (GOWNS) ×3 IMPLANT
GOWN STRL NON-REIN XL LVL4 (GOWNS) ×2
GOWN STRL REUS W/ TWL XL LVL3 (GOWN DISPOSABLE) ×6 IMPLANT
GOWN STRL REUS W/TWL XL LVL3 (GOWN DISPOSABLE) ×8
HEMOSTAT SURGICEL 4X8 (HEMOSTASIS) ×3 IMPLANT
HOLDER FOLEY CATH W/STRAP (MISCELLANEOUS) ×3 IMPLANT
IRRIG SUCT STRYKERFLOW 2 WTIP (MISCELLANEOUS) ×2
IRRIGATION SUCT STRKRFLW 2 WTP (MISCELLANEOUS) ×3 IMPLANT
KIT BASIN OR (CUSTOM PROCEDURE TRAY) ×3 IMPLANT
KIT TURNOVER KIT A (KITS) IMPLANT
LOOP CUT BIPOLAR 24F LRG (ELECTROSURGICAL) IMPLANT
LOOP VESSEL MAXI BLUE (MISCELLANEOUS) IMPLANT
MANIFOLD NEPTUNE II (INSTRUMENTS) ×3 IMPLANT
MARKER SKIN DUAL TIP RULER LAB (MISCELLANEOUS) ×3 IMPLANT
NDL INSUFFLATION 14GA 120MM (NEEDLE) ×3 IMPLANT
NEEDLE INSUFFLATION 14GA 120MM (NEEDLE) ×2 IMPLANT
PACK CYSTO (CUSTOM PROCEDURE TRAY) ×3 IMPLANT
PROTECTOR NERVE ULNAR (MISCELLANEOUS) ×6 IMPLANT
RELOAD STAPLE 45 2.6 WHT THIN (STAPLE) IMPLANT
SCISSORS LAP 5X45 EPIX DISP (ENDOMECHANICALS) ×3 IMPLANT
SEAL CANN UNIV 5-8 DVNC XI (MISCELLANEOUS) ×12 IMPLANT
SEAL XI 5MM-8MM UNIVERSAL (MISCELLANEOUS) ×8
SET TUBE SMOKE EVAC HIGH FLOW (TUBING) ×3 IMPLANT
SLEEVE ADV FIXATION 12X100MM (TROCAR) IMPLANT
SOL ELECTROSURG ANTI STICK (MISCELLANEOUS) ×2
SOLUTION ELECTROSURG ANTI STCK (MISCELLANEOUS) ×3 IMPLANT
SPIKE FLUID TRANSFER (MISCELLANEOUS) ×3 IMPLANT
STAPLE RELOAD 45 WHT (STAPLE) IMPLANT
STAPLE RELOAD 45MM WHITE (STAPLE)
SURGIFLO W/THROMBIN 8M KIT (HEMOSTASIS) IMPLANT
SUT ETHILON 2 0 PSLX (SUTURE) IMPLANT
SUT MNCRL AB 4-0 PS2 18 (SUTURE) ×6 IMPLANT
SUT PDS AB 0 CT1 36 (SUTURE) IMPLANT
SUT V-LOC BARB 180 2/0GR9 GS23 (SUTURE) ×2
SUT VIC AB 1 CT1 36 (SUTURE) ×12 IMPLANT
SUT VICRYL 0 UR6 27IN ABS (SUTURE) IMPLANT
SUT VLOC BARB 180 ABS3/0GR12 (SUTURE) ×4
SUTURE V-LC BRB 180 2/0GR9GS23 (SUTURE) ×3 IMPLANT
SUTURE VLOC BRB 180 ABS3/0GR12 (SUTURE) ×6 IMPLANT
SYR TOOMEY IRRIG 70ML (MISCELLANEOUS)
SYRINGE TOOMEY IRRIG 70ML (MISCELLANEOUS) IMPLANT
SYS BAG RETRIEVAL 10MM (BASKET) ×2
SYSTEM BAG RETRIEVAL 10MM (BASKET) ×3 IMPLANT
TOWEL OR 17X26 10 PK STRL BLUE (TOWEL DISPOSABLE) ×3 IMPLANT
TRAY FOLEY MTR SLVR 16FR STAT (SET/KITS/TRAYS/PACK) ×3 IMPLANT
TRAY LAPAROSCOPIC (CUSTOM PROCEDURE TRAY) ×3 IMPLANT
TROCAR Z THREAD OPTICAL 12X100 (TROCAR) ×3 IMPLANT
TROCAR Z-THREAD OPTICAL 5X100M (TROCAR) IMPLANT
TUBING CONNECTING 10 (TUBING) ×3 IMPLANT
TUBING UROLOGY SET (TUBING) ×3 IMPLANT
WATER STERILE IRR 1000ML POUR (IV SOLUTION) ×3 IMPLANT

## 2022-09-12 NOTE — Discharge Instructions (Signed)
Activity:  You are encouraged to ambulate frequently (about every hour during waking hours) to help prevent blood clots from forming in your legs or lungs.  However, you should not engage in any heavy lifting (> 10-15 lbs), strenuous activity, or straining. Diet: You should advance your diet as instructed by your physician.  It will be normal to have some bloating, nausea, and abdominal discomfort intermittently. Prescriptions:  You will be provided a prescription for pain medication to take as needed.  If your pain is not severe enough to require the prescription pain medication, you may take extra strength Tylenol instead which will have less side effects.  You should also take a prescribed stool softener to avoid straining with bowel movements as the prescription pain medication may constipate you. Incisions: You may remove your dressing bandages 48 hours after surgery if not removed in the hospital.  You will either have some small staples or special tissue glue at each of the incision sites. Once the bandages are removed (if present), the incisions may stay open to air.  You may start showering (but not soaking or bathing in water) the 2nd day after surgery and the incisions simply need to be patted dry after the shower.  No additional care is needed. What to call us about: You should call the office [phone]) if you develop fever > 101 or develop persistent vomiting.   You may resume aspirin, advil, aleve, vitamins, and supplements 7 days after surgery.  PATIENT GIVEN LIST OF [facility] PRIMARY CARE PROVIDERS

## 2022-09-12 NOTE — Transfer of Care (Signed)
Immediate Anesthesia Transfer of Care Note  Patient: Helen Rogers  Procedure(s) Performed: XI ROBOTIC ASSITED PARTIAL NEPHRECTOMY (Left) FLEXIBLE CYSTOSCOPY  Patient Location: PACU  Anesthesia Type:General  Level of Consciousness: awake, alert , oriented, and patient cooperative  Airway & Oxygen Therapy: Patient Spontanous Breathing and Patient connected to face mask oxygen  Post-op Assessment: Report given to RN, Post -op Vital signs reviewed and stable, and Patient moving all extremities  Post vital signs: Reviewed and stable  Last Vitals:  Vitals Value Taken Time  BP 138/83 09/12/22 1520  Temp    Pulse 78 09/12/22 1522  Resp 16 09/12/22 1522  SpO2 96 % 09/12/22 1522  Vitals shown include unvalidated device data.  Last Pain:  Vitals:   09/12/22 1113  TempSrc: Oral  PainSc:          Complications: No notable events documented.

## 2022-09-12 NOTE — Anesthesia Procedure Notes (Signed)
Procedure Name: Intubation Date/Time: 09/12/2022 12:35 PM  Performed by: Randye Lobo, CRNAPre-anesthesia Checklist: Patient identified, Emergency Drugs available, Suction available and Patient being monitored Patient Re-evaluated:Patient Re-evaluated prior to induction Oxygen Delivery Method: Circle System Utilized Preoxygenation: Pre-oxygenation with 100% oxygen Induction Type: IV induction Ventilation: Mask ventilation without difficulty Laryngoscope Size: Mac and 3 Grade View: Grade III Tube type: Oral Tube size: 7.0 mm Number of attempts: 1 Airway Equipment and Method: Stylet and Oral airway Placement Confirmation: ETT inserted through vocal cords under direct vision, positive ETCO2 and breath sounds checked- equal and bilateral Tube secured with: Tape Dental Injury: Teeth and Oropharynx as per pre-operative assessment

## 2022-09-12 NOTE — Anesthesia Preprocedure Evaluation (Signed)
Anesthesia Evaluation  Patient identified by MRN, date of birth, ID band Patient awake    Reviewed: Allergy & Precautions, H&P , NPO status , Patient's Chart, lab work & pertinent test results  Airway Mallampati: II  TM Distance: >3 FB Neck ROM: Full    Dental no notable dental hx.    Pulmonary neg pulmonary ROS   Pulmonary exam normal breath sounds clear to auscultation       Cardiovascular hypertension, Pt. on medications Normal cardiovascular exam Rhythm:Regular Rate:Normal     Neuro/Psych negative neurological ROS  negative psych ROS   GI/Hepatic negative GI ROS, Neg liver ROS,,,  Endo/Other  negative endocrine ROS    Renal/GU negative Renal ROS  negative genitourinary   Musculoskeletal negative musculoskeletal ROS (+)    Abdominal   Peds negative pediatric ROS (+)  Hematology negative hematology ROS (+)   Anesthesia Other Findings   Reproductive/Obstetrics negative OB ROS                             Anesthesia Physical Anesthesia Plan  ASA: 2  Anesthesia Plan: General   Post-op Pain Management: Toradol IV (intra-op)*   Induction: Intravenous  PONV Risk Score and Plan: 3 and Ondansetron, Dexamethasone, Treatment may vary due to age or medical condition and Midazolam  Airway Management Planned: Oral ETT  Additional Equipment:   Intra-op Plan:   Post-operative Plan: Extubation in OR  Informed Consent: I have reviewed the patients History and Physical, chart, labs and discussed the procedure including the risks, benefits and alternatives for the proposed anesthesia with the patient or authorized representative who has indicated his/her understanding and acceptance.     Dental advisory given  Plan Discussed with: CRNA and Surgeon  Anesthesia Plan Comments:        Anesthesia Quick Evaluation

## 2022-09-12 NOTE — Op Note (Signed)
Operative Note  Preoperative diagnosis:  1.  1.8 cm left renal mass  Postoperative diagnosis: 1.  1.8 cm left renal mass  Procedure(s): 1.  Robot-assisted laparoscopic left partial nephrectomy 2.  Intraoperative ultrasound of single retroperitoneal organ  Surgeon: Ellison Hughs, MD  Assistants: 1.  Debbrah Alar, PA-C  An assistant was required for this surgical procedure.  The duties of the assistant included but were not limited to suctioning, passing suture, camera manipulation, retraction.  This procedure would not be able to be performed without an Environmental consultant.  2.  Hinton Rao, MD PGY4  Anesthesia:  General  Complications:  None  EBL: 50 mL  Specimens: 1.  Left renal mass  Drains/Catheters: 1.  Foley catheter  Intraoperative findings:   Exophytic left upper pole renal mass Grossly negative surgical margins following excision of left renal mass Renorrhaphy was hemostatic at the conclusion of the case  Indication:  Helen Rogers is a 57 y.o. 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  She has been consented for the above procedures, voices understanding and wishes to proceed.  Description of procedure:  After informed consent was obtained, the patient was brought to the operating room and general endotracheal anesthesia was administered.  A 16 French Foley catheter was then sterilely placed and set to gravity drainage.  The patient was then placed in the right lateral decubitus position and prepped and draped in usual sterile fashion.  A timeout was performed.  An 8 mm incision was then made lateral to the left rectus muscle at the level of the left 12th rib.  Abdominal access was obtained via a Veress needle.  The abdominal cavity was then insufflated up to 15 mmHg.  An 8 mm port was then introduced into the abdominal cavity.  Inspection of the port entry site by the robotic camera revealed no adjacent organ injury.  We then  placed 3 additional 8 mm robotic ports to triangulate the left renal hilum.  A 12 mm assistant port was then placed between the carmera port and 3rd robotic arm.  The white line of Toldt along the descending colon was incised sharply and the colon, along with its mesocolonic fat, was reflected medially until the aorta was identified.  We then made a small window adjacent to the lower pole of the left kidney, identifying the left psoas muscle, left ureter and left gonadal vein.  The left ureter and gonadal vein were then reflected anteriorly allowing [place] to then incised the perihilar attachments using electrocautery.  We encountered a small lumbar vein adjacent to the insertion of the left gonadal vein into the left renal vein.  This lumbar vein was ligated with hemo-lock clips in 2 places and incised sharply.  This provided [place] excellent exposure to the left renal hilum.  The perilymphatic tissue surrounding the left renal artery was carefully dissected away, creating a window to place a bulldog clamp later in the procedure.  The anterior portion of Gerota's fascia was incised, allowing reflection the perinephric fat medially and laterally until there was adequate exposure of the upper pole left renal mass.  Intraoperative ultrasound confirmed the heterogenous echogenicity of the lesion compared to the remainder of the renal parenchyma and allowed identification of the depth/borders of the mass, which were demarcated using electrocautery along the renal capsule.  We then exposed the left renal artery and placed a bulldog clamp, marking warm ischemia time.  The left kidney immediately became ischemic and pale  in appearance.  The left renal mass was then sharply excised with minimal blood loss.  After the mass was free, it was placed in the left upper quadrant to be retrieved later on during the operation.    The renorrhaphy was then performed using a 3-0 V-lock in the deep layer of the renal parenchyma.  The  bulldog clamp was then removed marking warm ischemia time at 15 minutes.   A series of 1-0 Vicryl sutures with Hem-o-lok clips acting as a buttress were then used to reapproximate the renal capsule.  There did not appear to be any obvious bleeding around the renal hilum nor surrounding our repair.  The incised Gerota's fascia overlying the mass was then reapproximated using a running 2-0 V lock suture.  The mass was then placed in an Endo Catch bag.  Surgicel and Vistaseal were then applied to the resection bed.  The robot was then de-docked and the camera was then reinserted into the assistant port. Laparoscopic graspers were then used to grab the string of the Endo Catch bag, which was brought out through the 12 mm assistant port.  The abdomen was then desufflated and all ports were removed.  The assistant port incision was then extended approximately 1-2 cm and the left renal mass, within the Endo Catch bag, was removed and sent to pathology for permanent section.  The fascia within the assistant port incision was then reapproximated using a 0 Vicryl suture.  The remainder of the incisions were then closed using 4-0 Monocryl and dressed appropriately.  Patient tolerated the procedure well and was transferred to the postanesthesia unit in stable condition.  Plan:  Monitor on the floor overnight

## 2022-09-12 NOTE — Anesthesia Postprocedure Evaluation (Signed)
Anesthesia Post Note  Patient: Helen Rogers  Procedure(s) Performed: XI ROBOTIC ASSITED PARTIAL NEPHRECTOMY (Left) FLEXIBLE CYSTOSCOPY     Patient location during evaluation: PACU Anesthesia Type: General Level of consciousness: awake and alert Pain management: pain level controlled Vital Signs Assessment: post-procedure vital signs reviewed and stable Respiratory status: spontaneous breathing, nonlabored ventilation, respiratory function stable and patient connected to nasal cannula oxygen Cardiovascular status: blood pressure returned to baseline and stable Postop Assessment: no apparent nausea or vomiting Anesthetic complications: no  No notable events documented.  Last Vitals:  Vitals:   09/12/22 1518 09/12/22 1530  BP: 134/83 134/69  Pulse: 87 87  Resp: 16 20  Temp: (!) 36.1 C   SpO2: 100% 100%    Last Pain:  Vitals:   09/12/22 1530  TempSrc:   PainSc: Asleep                 Mykaylah Ballman S

## 2022-09-13 ENCOUNTER — Encounter (HOSPITAL_COMMUNITY): Payer: Self-pay | Admitting: Urology

## 2022-09-13 DIAGNOSIS — N2889 Other specified disorders of kidney and ureter: Secondary | ICD-10-CM | POA: Diagnosis not present

## 2022-09-13 LAB — BASIC METABOLIC PANEL
Anion gap: 10 (ref 5–15)
BUN: 20 mg/dL (ref 6–20)
CO2: 24 mmol/L (ref 22–32)
Calcium: 8.7 mg/dL — ABNORMAL LOW (ref 8.9–10.3)
Chloride: 102 mmol/L (ref 98–111)
Creatinine, Ser: 1.76 mg/dL — ABNORMAL HIGH (ref 0.44–1.00)
GFR, Estimated: 33 mL/min — ABNORMAL LOW (ref >60)
Glucose, Bld: 106 mg/dL — ABNORMAL HIGH (ref 70–99)
Potassium: 4.3 mmol/L (ref 3.5–5.1)
Sodium: 136 mmol/L (ref 135–145)

## 2022-09-13 LAB — HEMOGLOBIN AND HEMATOCRIT, BLOOD
HCT: 37.9 % (ref 36.0–46.0)
Hemoglobin: 12 g/dL (ref 12.0–15.0)

## 2022-09-13 MED ORDER — ACETAMINOPHEN 500 MG PO TABS
1000.0000 mg | ORAL_TABLET | Freq: Once | ORAL | Status: AC
  Administered 2022-09-13: 1000 mg via ORAL

## 2022-09-13 MED ORDER — CHLORHEXIDINE GLUCONATE CLOTH 2 % EX PADS
6.0000 | MEDICATED_PAD | Freq: Every day | CUTANEOUS | Status: DC
  Administered 2022-09-13: 6 via TOPICAL

## 2022-09-13 NOTE — Progress Notes (Signed)
1 Day Post-Op Subjective: Patient reports mild incision pain. Toelrating clear liquid diet. Negative flatus. Ambulating in halls with assistance  Objective: Vital signs in last 24 hours: Temp:  [97.9 F (36.6 C)-99.7 F (37.6 C)] 99.7 F (37.6 C) (02/17 1320) Pulse Rate:  [59-88] 64 (02/17 1320) Resp:  [16-18] 16 (02/17 0900) BP: (105-138)/(65-83) 127/75 (02/17 1320) SpO2:  [97 %-100 %] 100 % (02/17 1320)  Intake/Output from previous day: 02/16 0701 - 02/17 0700 In: 3752.2 [P.O.:1360; I.V.:2292.2; IV Piggyback:100] Out: 2450 [Urine:2400; Blood:50] Intake/Output this shift: Total I/O In: -  Out: 2160 [Urine:2160]  Physical Exam:  General:alert, cooperative, and appears stated age GI: soft, non tender, normal bowel sounds, no palpable masses, no organomegaly, no inguinal hernia Female genitalia: not done Extremities: extremities normal, atraumatic, no cyanosis or edema  Lab Results: Recent Labs    09/13/22 0623  HGB 12.0  HCT 37.9   BMET Recent Labs    09/13/22 0623  NA 136  K 4.3  CL 102  CO2 24  GLUCOSE 106*  BUN 20  CREATININE 1.76*  CALCIUM 8.7*   No results for input(s): "LABPT", "INR" in the last 72 hours. No results for input(s): "LABURIN" in the last 72 hours. Results for orders placed or performed in visit on 05/30/20  Novel Coronavirus, NAA ([facility])     Status: None   Collection Time: 05/30/20 10:59 AM   Specimen: Nasopharyngeal(NP) swabs in vial transport medium   Nasopharynge  Screenin  Result Value Ref Range Status   SARS-CoV-2, NAA Not Detected Not Detected Final    Comment: This nucleic acid amplification test was developed and its performance characteristics determined by [employer]. Nucleic acid amplification tests include RT-PCR and TMA. This test has not been FDA cleared or approved. This test has been authorized by FDA under an Emergency Use Authorization (EUA). This test is only authorized for the duration of time the  declaration that circumstances exist justifying the authorization of the emergency use of in vitro diagnostic tests for detection of SARS-CoV-2 virus and/or diagnosis of COVID-19 infection under section 564(b)(1) of the Act, 21 U.S.C. [id number]) (1), unless the authorization is terminated or revoked sooner. When diagnostic testing is negative, the possibility of a false negative result should be considered in the context of a patient's recent exposures and the presence of clinical signs and symptoms consistent with COVID-19. An individual without symptoms of COVID-19 and who is not shedding SARS-CoV-2 virus wo uld expect to have a negative (not detected) result in this assay.   SARS-COV-2, NAA 2 DAY TAT     Status: None   Collection Time: 05/30/20 10:59 AM   Nasopharynge  Screenin  Result Value Ref Range Status   SARS-CoV-2, NAA 2 DAY TAT Performed  Final    Studies/Results: No results found.  Assessment/Plan: POD#1 left robotic partial nephrectomy 1. Discontinue foley 2. Advance diet as tolerated 3. Ambulate in halls with assistance 4. Continue current pain control regiment   LOS: 0 days   Nicolette Bang 09/13/2022, 6:59 PM

## 2022-09-13 NOTE — Progress Notes (Signed)
Mobility Specialist - Progress Note   09/13/22 1113  Mobility  Activity Ambulated independently in hallway  Level of Assistance Modified independent, requires aide device or extra time  Assistive Device None  Distance Ambulated (ft) 480 ft  Range of Motion/Exercises Active  Activity Response Tolerated well  Mobility Referral Yes  $Mobility charge 1 Mobility   Pt was found in bed and agreeable to ambulate. Had some abdominal soreness during session. Stated feeling a little worried about going up and down stairs when discharged home, told pt we could do another session to practice stairs. At EOS returned to bed with all necessities in reach.  Ferd Hibbs Mobility Specialist

## 2022-09-14 DIAGNOSIS — N2889 Other specified disorders of kidney and ureter: Secondary | ICD-10-CM | POA: Diagnosis not present

## 2022-09-14 NOTE — TOC Initial Note (Signed)
Transition of Care [facility]) - Initial/Assessment Note    Patient Details  Name: Helen Rogers MRN: [id number] Date of Birth: 04-06-1966  Transition of Care [facility]) Helen Rogers/SW Contact:    Helen Dine, Helen Rogers Phone Number: 09/14/2022, 12:28 PM  Clinical Narrative:                 [facility] consult; no PCP listed for pt; spoke w/pt in room; she confirms she does not have a PCP; pt says she would like resource to find PCP; pt says she is from home and plans to return at d/c; she identified POC Helen Rogers (husband) [phone]; pt denies food insecurity, difficulty paying utilities, and IPV; she has transportation; pt says she has glasses; she does not have DME, [place] services, home oxygen, HA or dentures; pt given list of [place] PCPs; she will make her own appt; no TOC needs.  Expected Discharge Plan: Home/Self Care Barriers to Discharge: No Barriers Identified   Patient Goals and CMS Choice Patient states their goals for this hospitalization and ongoing recovery are:: home CMS Medicare.gov Compare Post Acute Care list provided to:: Patient Choice offered to / list presented to : Patient      Expected Discharge Plan and Services   Discharge Planning Services: Helen Rogers Consult Post Acute Care Choice: NA Living arrangements for the past 2 months: Single Family Home Expected Discharge Date: 09/14/22                                    Prior Living Arrangements/Services Living arrangements for the past 2 months: Single Family Home Lives with:: Spouse Patient language and need for interpreter reviewed:: Yes Do you feel safe going back to the place where you live?: Yes      Need for Family Participation in Patient Care: Yes (Comment) Care giver support system in place?: Yes (comment) Current home services:  (n/a) Criminal Activity/Legal Involvement Pertinent to Current Situation/Hospitalization: No - Comment as needed  Activities of Daily Living Home Assistive Devices/Equipment: None ADL  Screening (condition at time of admission) Patient's cognitive ability adequate to safely complete daily activities?: Yes Is the patient deaf or have difficulty hearing?: No Does the patient have difficulty seeing, even when wearing glasses/contacts?: No Does the patient have difficulty concentrating, remembering, or making decisions?: No Patient able to express need for assistance with ADLs?: Yes Does the patient have difficulty dressing or bathing?: No Independently performs ADLs?: Yes (appropriate for developmental age) Does the patient have difficulty walking or climbing stairs?: No Weakness of Legs: None Weakness of Arms/Hands: None  Permission Sought/Rogers Permission sought to share information with : Case Manager Permission Rogers to share information with : Yes, Helen Rogers  Share Information with NAME: Helen Coffin, Helen Rogers, Helen Rogers     Permission Rogers to share info w Relationship: Helen Rogers (spouse) [phone]     Emotional Assessment Appearance:: Appears stated age Attitude/Demeanor/Rapport: Gracious Affect (typically observed): Accepting Orientation: : Oriented to Self, Oriented to Place, Oriented to  Time, Oriented to Situation Alcohol / Substance Use: Not Applicable Psych Involvement: No (comment)  Admission diagnosis:  Renal mass [N28.89] Patient Active Problem List   Diagnosis Date Noted   Renal mass 09/12/2022   Essential hypertension 07/07/2016   Primary osteoarthritis of right hip 07/07/2016   Chronic kidney disease 07/07/2016   PCP:  Patient, No Pcp Per Pharmacy:   [id number]- [place] [place] [address] Phone: [phone] Fax: [phone]     Social Determinants of Health (SDOH) Social History: SDOH Screenings   Food Insecurity: No Food Insecurity (09/12/2022)  Housing: Low Risk  (09/12/2022)  Transportation Needs: No Transportation Needs (09/12/2022)   Utilities: Not At Risk (09/12/2022)  Tobacco Use: Low Risk  (09/13/2022)   SDOH Interventions:     Readmission Risk Interventions     No data to display

## 2022-09-14 NOTE — Discharge Summary (Signed)
Physician Discharge Summary  Patient ID: Helen Rogers MRN: [id number] DOB/AGE: [birth year] 57 y.o.  Admit date: 09/12/2022 Discharge date: 09/14/2022  Admission Diagnoses:  Renal mass  Discharge Diagnoses:  Principal Problem:   Renal mass   Past Medical History:  Diagnosis Date   Arthritis    CKD (chronic kidney disease)    stage 2 (02/2016); followed by Dr. Marval Regal   FSGS (focal segmental glomerulosclerosis)    History of shingles    Hypertension    takes Lisinopril daily   Insomnia    takes Melatonin nightly   Joint pain    Nocturia    Pneumonia    Spinal headache 2002   required blood patch    Surgeries: Procedure(s): XI ROBOTIC ASSITED PARTIAL NEPHRECTOMY FLEXIBLE CYSTOSCOPY on 09/12/2022   Consultants (if any): Treatment Team:  Ceasar Mons, MD  Discharged Condition: Improved  Hospital Course: Helen Rogers is an 57 y.o. female who was admitted 09/12/2022 with a diagnosis of Renal mass and went to the operating room on 09/12/2022 and underwent the above named procedures.    She was given perioperative antibiotics:  Anti-infectives (From admission, onward)    Start     Dose/Rate Route Frequency Ordered Stop   09/12/22 1038  ceFAZolin (ANCEF) IVPB 2g/100 mL premix        2 g 200 mL/hr over 30 Minutes Intravenous 30 min pre-op 09/12/22 1038 09/12/22 1236     .  She was given sequential compression devices, early ambulation, for DVT prophylaxis.  She benefited maximally from the hospital stay and there were no complications.    Recent vital signs:  Vitals:   09/13/22 2052 09/14/22 0356  BP: (!) 143/85 (!) 141/90  Pulse: 74 77  Resp: 19 16  Temp: 98.2 F (36.8 C) 98 F (36.7 C)  SpO2: 97% 96%    Recent laboratory studies:  Lab Results  Component Value Date   HGB 12.0 09/13/2022   HGB 13.6 09/02/2022   HGB 14.0 07/17/2016   Lab Results  Component Value Date   WBC 8.1 09/02/2022   PLT 264 09/02/2022   Lab Results  Component  Value Date   INR 0.89 07/17/2016   Lab Results  Component Value Date   NA 136 09/13/2022   K 4.3 09/13/2022   CL 102 09/13/2022   CO2 24 09/13/2022   BUN 20 09/13/2022   CREATININE 1.76 (H) 09/13/2022   GLUCOSE 106 (H) 09/13/2022    Discharge Medications:   Allergies as of 09/14/2022       Reactions   Oxycodone Hcl Itching   Hallucinations         Medication List     STOP taking these medications    ascorbic acid 500 MG tablet Commonly known as: VITAMIN C   BIOTIN PO   ibuprofen 200 MG tablet Commonly known as: ADVIL   MULTI ADULT GUMMIES PO   Vitamin D 50 MCG (2000 UT) tablet       TAKE these medications    calcium carbonate 500 MG chewable tablet Commonly known as: TUMS - dosed in mg elemental calcium Chew 2-3 tablets by mouth daily as needed for indigestion or heartburn.   docusate sodium 100 MG capsule Commonly known as: COLACE Take 1 capsule (100 mg total) by mouth 2 (two) times daily.   olmesartan 40 MG tablet Commonly known as: BENICAR Take 40 mg by mouth at bedtime.   promethazine 12.5 MG tablet Commonly known as: PHENERGAN Take 1 tablet (  12.5 mg total) by mouth every 4 (four) hours as needed for nausea or vomiting.   rosuvastatin 5 MG tablet Commonly known as: CRESTOR Take 5 mg by mouth daily.   traMADol 50 MG tablet Commonly known as: Ultram Take 1-2 tablets (50-100 mg total) by mouth every 6 (six) hours as needed for moderate pain or severe pain.   ZzzQuil 50 MG/30ML Liqd Generic drug: diphenhydrAMINE HCl (Sleep) Take 7.5-15 mLs by mouth at bedtime as needed (sleep).        Diagnostic Studies: No results found.  Disposition: Discharge disposition: 01-Home or Self Care          Follow-up Information     Ceasar Mons, MD Follow up on 09/26/2022.   Specialty: Urology Why: at 11:00 Contact information: [address] [place] [address] [phone]                   Signed: Nicolette Bang 09/14/2022, 10:19 AM

## 2022-09-17 LAB — SURGICAL PATHOLOGY

## 2022-12-17 ENCOUNTER — Other Ambulatory Visit: Payer: Self-pay | Admitting: Urology

## 2022-12-17 DIAGNOSIS — D3000 Benign neoplasm of unspecified kidney: Secondary | ICD-10-CM

## 2023-02-14 ENCOUNTER — Ambulatory Visit
Admission: RE | Admit: 2023-02-14 | Discharge: 2023-02-14 | Disposition: A | Discharge status: Home / Self Care | Payer: 59 | Source: Ambulatory Visit | Attending: Urology | Admitting: Urology

## 2023-02-14 DIAGNOSIS — D3000 Benign neoplasm of unspecified kidney: Secondary | ICD-10-CM

## 2023-02-14 MED ORDER — GADOPICLENOL 0.5 MMOL/ML IV SOLN
7.5000 mL | Freq: Once | INTRAVENOUS | Status: AC | PRN
  Administered 2023-02-14: 7.5 mL via INTRAVENOUS
